# Patient Record
Sex: Male | Born: 2000 | Race: White | Hispanic: No | Marital: Single | Smoking: Former smoker
Health system: Southern US, Community
[De-identification: ages and names within clinical notes are randomized; demographics above are authoritative.]

## PROBLEM LIST (undated history)

## (undated) DIAGNOSIS — F32A Depression, unspecified: Secondary | ICD-10-CM

## (undated) DIAGNOSIS — F419 Anxiety disorder, unspecified: Secondary | ICD-10-CM

## (undated) DIAGNOSIS — J45909 Unspecified asthma, uncomplicated: Secondary | ICD-10-CM

## (undated) DIAGNOSIS — I10 Essential (primary) hypertension: Secondary | ICD-10-CM

## (undated) DIAGNOSIS — F329 Major depressive disorder, single episode, unspecified: Secondary | ICD-10-CM

## (undated) DIAGNOSIS — F840 Autistic disorder: Secondary | ICD-10-CM

## (undated) HISTORY — PX: PYLOROPLASTY: SHX418

---
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
## 2019-04-21 ENCOUNTER — Emergency Department (HOSPITAL_COMMUNITY)
Admission: EM | Admit: 2019-04-21 | Discharge: 2019-04-21 | Disposition: A | Discharge status: Home / Self Care | Payer: Medicaid Other | Attending: Emergency Medicine | Admitting: Emergency Medicine

## 2019-04-21 ENCOUNTER — Other Ambulatory Visit: Payer: Self-pay

## 2019-04-21 ENCOUNTER — Encounter (HOSPITAL_COMMUNITY): Payer: Self-pay | Admitting: Emergency Medicine

## 2019-04-21 DIAGNOSIS — Z5321 Procedure and treatment not carried out due to patient leaving prior to being seen by health care provider: Secondary | ICD-10-CM | POA: Insufficient documentation

## 2019-04-21 DIAGNOSIS — T63441A Toxic effect of venom of bees, accidental (unintentional), initial encounter: Secondary | ICD-10-CM | POA: Diagnosis present

## 2019-04-21 NOTE — ED Triage Notes (Signed)
Pt was stung by wasp on right finger earlier and since mother is allergic to bees thought best if patient came to ED for evaluation. Pt denies any symptoms besides pain on finger that was stung on.

## 2019-10-29 ENCOUNTER — Ambulatory Visit: Payer: Self-pay | Admitting: Surgery

## 2019-10-29 NOTE — H&P (Signed)
History of Present Illness Tyler Cunningham. Tyler Hanken MD; 10/29/2019 3:46 PM) The patient is a 19 year old male who presents with a pilonidal cyst. Referred by Dr. Baltazar Cunningham for pilonidal abscess  This is a 19 year old male with autism who presents with several months of a tender swollen area near his tailbone. This area has opened up and has been having some yellowish drainage. This area is fairly tender with ambulation in one sitting. The patient has not had any previous episodes of pilonidal abscess. He was treated with antibiotics but is currently not on antibiotics. He is referred to [place] for surgical evaluation. He is accompanied by his mother. She is a former patient of mine.   Diagnostic Studies History [facility] [name], [name]; 10/29/2019 2:34 PM) Colonoscopy never  Allergies [facility] [name], CMA; 10/29/2019 2:34 PM) Rondec *COUGH/COLD/ALLERGY* Allergies Reconciled  Medication History ([name], CMA; 10/29/2019 2:35 PM) Effexor (Oral) Specific strength unknown - Active. Catapres (Oral) Specific strength unknown - Active. Tylenol (Oral) Specific strength unknown - Active. Medications Reconciled  Social History [name], CMA; 10/29/2019 2:34 PM) Alcohol use Occasional alcohol use. No drug use Tobacco use Current every day smoker.  Family History [name], [place]; 10/29/2019 2:34 PM) Arthritis Father, Mother. Breast Cancer Mother. Cancer Family Members In General. Depression Father. Heart disease in male family member before age 23 Hypertension Mother. Migraine Headache Brother, Father, Mother, Sister. Seizure disorder Sister.  Other Problems [name], CMA; 10/29/2019 2:34 PM) Anxiety Disorder Asthma Back Pain Depression High blood pressure Migraine Headache     Review of Systems ([name]; 10/29/2019 2:34 PM) General Present- Fatigue. Not Present- Appetite Loss, Chills, Fever, Night Sweats, Weight Gain and Weight Loss. HEENT Present-  Seasonal Allergies and Wears glasses/contact lenses. Not Present- Earache, Hearing Loss, Hoarseness, Nose Bleed, Oral Ulcers, Ringing in the Ears, Sinus Pain, Sore Throat, Visual Disturbances and Yellow Eyes. Breast Not Present- Breast Mass, Breast Pain, Nipple Discharge and Skin Changes. Cardiovascular Not Present- Chest Pain, Difficulty Breathing Lying Down, Leg Cramps, Palpitations, Rapid Heart Rate, Shortness of Breath and Swelling of Extremities. Gastrointestinal Not Present- Abdominal Pain, Bloating, Bloody Stool, Change in Bowel Habits, Chronic diarrhea, Constipation, Difficulty Swallowing, Excessive gas, Gets full quickly at meals, Hemorrhoids, Indigestion, Nausea, Rectal Pain and Vomiting. Musculoskeletal Present- Back Pain. Not Present- Joint Pain, Joint Stiffness, Muscle Pain, Muscle Weakness and Swelling of Extremities. Endocrine Not Present- Cold Intolerance, Excessive Hunger, Hair Changes, Heat Intolerance, Hot flashes and New Diabetes. Hematology Not Present- Blood Thinners, Easy Bruising, Excessive bleeding, Gland problems, HIV and Persistent Infections.  Vitals ([name] CMA; 10/29/2019 2:35 PM) 10/29/2019 2:35 PM Weight: 336.5 lb Height: 71in Body Surface Area: 2.63 m Body Mass Index: 46.93 kg/m  Temp.: 98.62F  Pulse: 115 (Regular)         Physical Exam [name] MD; 10/29/2019 3:47 PM)  The physical exam findings are as follows: Note:Constitutional: WDWN in NAD, conversant, no obvious deformities; resting comfortably Eyes: Pupils equal, round; sclera anicteric; moist conjunctiva; no lid lag HENT: Oral mucosa moist; good dentition Neck: No masses palpated, trachea midline; no thyromegaly Lungs: CTA bilaterally; normal respiratory effort CV: Regular rate and rhythm; no murmurs; extremities well-perfused with no edema Abd: +bowel sounds, soft, non-tender, no palpable organomegaly; no palpable hernias Coccygeal area - the patient has a 1.5 cm  protruding mass with a small opening with minimal drainage. This area is slightly tender to palpation. This is located just over the cuff 6. Inferiorly the patient has several open skin pits  with some crusting. No erythema or purulent drainage coming from these areas. The entire area covers a distance of about 2.5-3 cm. Musc: Normal gait; no apparent clubbing or cyanosis in extremities Lymphatic: No palpable cervical or axillary lymphadenopathy Skin: Warm, dry; no sign of jaundice Psychiatric - alert and oriented x 4; calm mood and affect    Assessment & Plan [name] K. Ethelyne Erich MD; 10/29/2019 2:43 PM)  PILONIDAL SINUS WITH ABSCESS (L05.02)  Current Plans Schedule for Surgery - Pilonidal cystectomy. The surgical procedure has been discussed with the patient. Potential risks, benefits, alternative treatments, and expected outcomes have been explained. All of the patient's questions at this time have been answered. The likelihood of reaching the patient's treatment goal is good. The patient understand the proposed surgical procedure and wishes to proceed.  [name]. [name], MD, [facility] Surgery  General/ Trauma Surgery   10/29/2019 3:48 PM

## 2019-12-24 ENCOUNTER — Other Ambulatory Visit: Payer: Self-pay

## 2019-12-24 ENCOUNTER — Encounter (HOSPITAL_BASED_OUTPATIENT_CLINIC_OR_DEPARTMENT_OTHER): Payer: Self-pay | Admitting: Surgery

## 2019-12-25 ENCOUNTER — Other Ambulatory Visit (HOSPITAL_COMMUNITY)
Admission: RE | Admit: 2019-12-25 | Discharge: 2019-12-25 | Disposition: A | Discharge status: Home / Self Care | Payer: Medicare Other | Source: Ambulatory Visit | Attending: Surgery | Admitting: Surgery

## 2019-12-25 DIAGNOSIS — Z01812 Encounter for preprocedural laboratory examination: Secondary | ICD-10-CM | POA: Insufficient documentation

## 2019-12-25 DIAGNOSIS — Z20822 Contact with and (suspected) exposure to covid-19: Secondary | ICD-10-CM | POA: Insufficient documentation

## 2019-12-25 LAB — SARS CORONAVIRUS 2 (TAT 6-24 HRS): SARS Coronavirus 2: NEGATIVE

## 2019-12-28 NOTE — Progress Notes (Signed)
Left message with patient to remind him of coming to [facility] for anesthesia consult.

## 2019-12-29 ENCOUNTER — Encounter (HOSPITAL_BASED_OUTPATIENT_CLINIC_OR_DEPARTMENT_OTHER): Payer: Self-pay | Admitting: Surgery

## 2019-12-29 ENCOUNTER — Other Ambulatory Visit: Payer: Self-pay

## 2019-12-29 ENCOUNTER — Ambulatory Visit (HOSPITAL_BASED_OUTPATIENT_CLINIC_OR_DEPARTMENT_OTHER): Payer: Medicare Other | Admitting: Anesthesiology

## 2019-12-29 ENCOUNTER — Ambulatory Visit (HOSPITAL_BASED_OUTPATIENT_CLINIC_OR_DEPARTMENT_OTHER)
Admission: RE | Admit: 2019-12-29 | Discharge: 2019-12-29 | Disposition: A | Discharge status: Home / Self Care | Payer: Medicare Other | Attending: Surgery | Admitting: Surgery

## 2019-12-29 ENCOUNTER — Encounter (HOSPITAL_BASED_OUTPATIENT_CLINIC_OR_DEPARTMENT_OTHER)
Admission: RE | Disposition: A | Discharge status: Home / Self Care | Payer: Self-pay | Source: Home / Self Care | Attending: Surgery

## 2019-12-29 DIAGNOSIS — F329 Major depressive disorder, single episode, unspecified: Secondary | ICD-10-CM | POA: Diagnosis not present

## 2019-12-29 DIAGNOSIS — L0502 Pilonidal sinus with abscess: Secondary | ICD-10-CM | POA: Diagnosis not present

## 2019-12-29 DIAGNOSIS — F17200 Nicotine dependence, unspecified, uncomplicated: Secondary | ICD-10-CM | POA: Insufficient documentation

## 2019-12-29 DIAGNOSIS — J45909 Unspecified asthma, uncomplicated: Secondary | ICD-10-CM | POA: Diagnosis not present

## 2019-12-29 DIAGNOSIS — Z79899 Other long term (current) drug therapy: Secondary | ICD-10-CM | POA: Insufficient documentation

## 2019-12-29 DIAGNOSIS — Z6841 Body Mass Index (BMI) 40.0 and over, adult: Secondary | ICD-10-CM | POA: Diagnosis not present

## 2019-12-29 DIAGNOSIS — F419 Anxiety disorder, unspecified: Secondary | ICD-10-CM | POA: Insufficient documentation

## 2019-12-29 DIAGNOSIS — F840 Autistic disorder: Secondary | ICD-10-CM | POA: Insufficient documentation

## 2019-12-29 DIAGNOSIS — L0501 Pilonidal cyst with abscess: Secondary | ICD-10-CM | POA: Diagnosis present

## 2019-12-29 DIAGNOSIS — I10 Essential (primary) hypertension: Secondary | ICD-10-CM | POA: Insufficient documentation

## 2019-12-29 HISTORY — PX: PILONIDAL CYST EXCISION: SHX744

## 2019-12-29 SURGERY — EXCISION, SIMPLE PILONIDAL CYST
Anesthesia: General | Site: Coccyx

## 2019-12-29 MED ORDER — PROPOFOL 10 MG/ML IV BOLUS
INTRAVENOUS | Status: AC
  Filled 2019-12-29: qty 40

## 2019-12-29 MED ORDER — ACETAMINOPHEN 500 MG PO TABS
1000.0000 mg | ORAL_TABLET | Freq: Once | ORAL | Status: AC
  Administered 2019-12-29: 1000 mg via ORAL

## 2019-12-29 MED ORDER — PROPOFOL 500 MG/50ML IV EMUL
INTRAVENOUS | Status: AC
  Filled 2019-12-29: qty 50

## 2019-12-29 MED ORDER — LACTATED RINGERS IV SOLN: INTRAVENOUS | Status: DC

## 2019-12-29 MED ORDER — DEXAMETHASONE SODIUM PHOSPHATE 10 MG/ML IJ SOLN
INTRAMUSCULAR | Status: AC
  Filled 2019-12-29: qty 2

## 2019-12-29 MED ORDER — CHLORHEXIDINE GLUCONATE CLOTH 2 % EX PADS: 6.0000 | MEDICATED_PAD | Freq: Once | CUTANEOUS | Status: DC

## 2019-12-29 MED ORDER — FENTANYL CITRATE (PF) 100 MCG/2ML IJ SOLN
INTRAMUSCULAR | Status: AC
  Filled 2019-12-29: qty 2

## 2019-12-29 MED ORDER — LIDOCAINE 2% (20 MG/ML) 5 ML SYRINGE
INTRAMUSCULAR | Status: AC
  Filled 2019-12-29: qty 15

## 2019-12-29 MED ORDER — OXYCODONE HCL 5 MG PO TABS: 5.0000 mg | ORAL_TABLET | Freq: Four times a day (QID) | ORAL | 0 refills | Status: DC | PRN

## 2019-12-29 MED ORDER — CEFAZOLIN SODIUM-DEXTROSE 2-4 GM/100ML-% IV SOLN
INTRAVENOUS | Status: AC
  Filled 2019-12-29: qty 100

## 2019-12-29 MED ORDER — ONDANSETRON HCL 4 MG/2ML IJ SOLN
INTRAMUSCULAR | Status: DC | PRN
  Administered 2019-12-29: 4 mg via INTRAVENOUS

## 2019-12-29 MED ORDER — FENTANYL CITRATE (PF) 100 MCG/2ML IJ SOLN
INTRAMUSCULAR | Status: DC | PRN
  Administered 2019-12-29: 100 ug via INTRAVENOUS
  Administered 2019-12-29 (×2): 50 ug via INTRAVENOUS

## 2019-12-29 MED ORDER — SUGAMMADEX SODIUM 200 MG/2ML IV SOLN
INTRAVENOUS | Status: DC | PRN
  Administered 2019-12-29: 300 mg via INTRAVENOUS

## 2019-12-29 MED ORDER — PROPOFOL 10 MG/ML IV BOLUS
INTRAVENOUS | Status: DC | PRN
  Administered 2019-12-29: 200 mg via INTRAVENOUS

## 2019-12-29 MED ORDER — ACETAMINOPHEN 500 MG PO TABS
ORAL_TABLET | ORAL | Status: AC
  Filled 2019-12-29: qty 2

## 2019-12-29 MED ORDER — SUGAMMADEX SODIUM 500 MG/5ML IV SOLN
INTRAVENOUS | Status: AC
  Filled 2019-12-29: qty 5

## 2019-12-29 MED ORDER — OXYCODONE HCL 5 MG/5ML PO SOLN: 5.0000 mg | Freq: Once | ORAL | Status: DC | PRN

## 2019-12-29 MED ORDER — MIDAZOLAM HCL 2 MG/2ML IJ SOLN
INTRAMUSCULAR | Status: AC
  Filled 2019-12-29: qty 2

## 2019-12-29 MED ORDER — DEXTROSE 5 % IV SOLN
3.0000 g | INTRAVENOUS | Status: AC
  Administered 2019-12-29: 10:00:00 3 g via INTRAVENOUS

## 2019-12-29 MED ORDER — DEXAMETHASONE SODIUM PHOSPHATE 10 MG/ML IJ SOLN
INTRAMUSCULAR | Status: DC | PRN
  Administered 2019-12-29: 5 mg via INTRAVENOUS

## 2019-12-29 MED ORDER — PROPOFOL 10 MG/ML IV BOLUS
INTRAVENOUS | Status: AC
  Filled 2019-12-29: qty 20

## 2019-12-29 MED ORDER — PROMETHAZINE HCL 25 MG/ML IJ SOLN: 6.2500 mg | INTRAMUSCULAR | Status: DC | PRN

## 2019-12-29 MED ORDER — FENTANYL CITRATE (PF) 100 MCG/2ML IJ SOLN: 25.0000 ug | INTRAMUSCULAR | Status: DC | PRN

## 2019-12-29 MED ORDER — BUPIVACAINE HCL (PF) 0.25 % IJ SOLN
INTRAMUSCULAR | Status: DC | PRN
Start: 2019-12-29 — End: 2019-12-29
  Administered 2019-12-29: 30 mL

## 2019-12-29 MED ORDER — LIDOCAINE 2% (20 MG/ML) 5 ML SYRINGE
INTRAMUSCULAR | Status: DC | PRN
  Administered 2019-12-29: 100 mg via INTRAVENOUS

## 2019-12-29 MED ORDER — MIDAZOLAM HCL 5 MG/5ML IJ SOLN
INTRAMUSCULAR | Status: DC | PRN
  Administered 2019-12-29: 2 mg via INTRAVENOUS

## 2019-12-29 MED ORDER — CEFAZOLIN SODIUM-DEXTROSE 1-4 GM/50ML-% IV SOLN
INTRAVENOUS | Status: AC
  Filled 2019-12-29: qty 50

## 2019-12-29 MED ORDER — ONDANSETRON HCL 4 MG/2ML IJ SOLN
INTRAMUSCULAR | Status: AC
  Filled 2019-12-29: qty 4

## 2019-12-29 MED ORDER — ROCURONIUM BROMIDE 10 MG/ML (PF) SYRINGE
PREFILLED_SYRINGE | INTRAVENOUS | Status: DC | PRN
  Administered 2019-12-29: 60 mg via INTRAVENOUS

## 2019-12-29 MED ORDER — OXYCODONE HCL 5 MG PO TABS: 5.0000 mg | ORAL_TABLET | Freq: Once | ORAL | Status: DC | PRN

## 2019-12-29 SURGICAL SUPPLY — 41 items
APL SKNCLS STERI-STRIP NONHPOA (GAUZE/BANDAGES/DRESSINGS) ×1
BENZOIN TINCTURE PRP APPL 2/3 (GAUZE/BANDAGES/DRESSINGS) ×3 IMPLANT
BLADE CLIPPER SURG (BLADE) ×3 IMPLANT
BLADE SURG 10 STRL SS (BLADE) ×3 IMPLANT
BRIEF STRETCH FOR OB PAD XXL (UNDERPADS AND DIAPERS) ×2 IMPLANT
CANISTER SUCT 1200ML W/VALVE (MISCELLANEOUS) ×3 IMPLANT
CLEANER CAUTERY TIP 5X5 PAD (MISCELLANEOUS) ×1 IMPLANT
COVER BACK TABLE 60X90IN (DRAPES) ×3 IMPLANT
COVER MAYO STAND STRL (DRAPES) ×3 IMPLANT
DRAPE LAPAROTOMY T 102X78X121 (DRAPES) ×3 IMPLANT
DRSG PAD ABDOMINAL 8X10 ST (GAUZE/BANDAGES/DRESSINGS) ×2 IMPLANT
ELECT REM PT RETURN 9FT ADLT (ELECTROSURGICAL) ×3
ELECTRODE REM PT RTRN 9FT ADLT (ELECTROSURGICAL) ×1 IMPLANT
GAUZE SPONGE 4X4 12PLY STRL (GAUZE/BANDAGES/DRESSINGS) ×2 IMPLANT
GAUZE SPONGE 4X4 12PLY STRL LF (GAUZE/BANDAGES/DRESSINGS) ×6 IMPLANT
GLOVE BIO SURGEON STRL SZ 6.5 (GLOVE) ×1 IMPLANT
GLOVE BIO SURGEON STRL SZ7 (GLOVE) ×3 IMPLANT
GLOVE BIO SURGEONS STRL SZ 6.5 (GLOVE) ×1
GLOVE BIOGEL PI IND STRL 6.5 (GLOVE) IMPLANT
GLOVE BIOGEL PI IND STRL 7.5 (GLOVE) ×1 IMPLANT
GLOVE BIOGEL PI INDICATOR 6.5 (GLOVE) ×2
GLOVE BIOGEL PI INDICATOR 7.5 (GLOVE) ×2
GOWN STRL REUS W/ TWL LRG LVL3 (GOWN DISPOSABLE) ×1 IMPLANT
GOWN STRL REUS W/TWL LRG LVL3 (GOWN DISPOSABLE) ×6
NEEDLE HYPO 22GX1.5 SAFETY (NEEDLE) ×3 IMPLANT
PACK BASIN DAY SURGERY FS (CUSTOM PROCEDURE TRAY) ×3 IMPLANT
PAD CLEANER CAUTERY TIP 5X5 (MISCELLANEOUS) ×2
PENCIL SMOKE EVACUATOR (MISCELLANEOUS) ×3 IMPLANT
SLEEVE SCD COMPRESS KNEE MED (MISCELLANEOUS) ×3 IMPLANT
SPONGE LAP 18X18 RF (DISPOSABLE) ×2 IMPLANT
SPONGE LAP 4X18 RFD (DISPOSABLE) ×3 IMPLANT
STAPLER VISISTAT 35W (STAPLE) ×3 IMPLANT
SUT VIC AB 2-0 CT1 27 (SUTURE) ×3
SUT VIC AB 2-0 CT1 TAPERPNT 27 (SUTURE) ×1 IMPLANT
SUT VICRYL 3-0 CR8 SH (SUTURE) ×2 IMPLANT
SYR CONTROL 10ML LL (SYRINGE) ×3 IMPLANT
TAPE CLOTH 3X10 TAN LF (GAUZE/BANDAGES/DRESSINGS) ×3 IMPLANT
TRAY DSU PREP LF (CUSTOM PROCEDURE TRAY) ×3 IMPLANT
TUBE CONNECTING 20'X1/4 (TUBING) ×1
TUBE CONNECTING 20X1/4 (TUBING) ×2 IMPLANT
YANKAUER SUCT BULB TIP NO VENT (SUCTIONS) ×3 IMPLANT

## 2019-12-29 NOTE — Anesthesia Postprocedure Evaluation (Signed)
Anesthesia Post Note  Patient: Tyler Cunningham  Procedure(s) Performed: PILONIDAL CYSTECTOMY (N/A Coccyx)     Patient location during evaluation: PACU Anesthesia Type: General Level of consciousness: awake and alert and oriented Pain management: pain level controlled Vital Signs Assessment: post-procedure vital signs reviewed and stable Respiratory status: spontaneous breathing, nonlabored ventilation and respiratory function stable Cardiovascular status: blood pressure returned to baseline Postop Assessment: no apparent nausea or vomiting Anesthetic complications: no   No complications documented.  Last Vitals:  Vitals:   12/29/19 1130 12/29/19 1144  BP: 118/72   Pulse: 95 91  Resp: 16   Temp:    SpO2: 96% 96%    Last Pain:  Vitals:   12/29/19 1144  TempSrc:   PainSc: 0-No pain                 Kaylyn Layer

## 2019-12-29 NOTE — Op Note (Signed)
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 This area has opened up and has been having some yellowish drainage. This area is fairly tender with ambulation in one sitting. The patient has not had any previous episodes of pilonidal abscess. He was treated with antibiotics but is currently not on antibiotics.   Upon examination in the office, he had a palpable 1.5 cm chronic draining abscess.  He also has multiple open skin pits in the midline.  We recommended complete pilonidal cystectomy to prevent chronic drainage from this area.  Description of procedure: The patient is brought to the operating room and placed in the supine position on a stretcher.  After an adequate level of general anesthesia was obtained, he was moved to a prone position on the operating table.  His natal cleft was shaved, prepped with Betadine, and draped in sterile fashion.  A timeout was taken to ensure the proper patient and proper procedure.  The patient has a 1.5 cm area that is protruding slightly with a chronic open draining sinus.  More inferiorly, he has multiple open skin pits in the midline.  There are long protruding hairs coming from these skin pits.  I made an elliptical incision to include all of the skin pits as well as the chronic abscess posteriorly.  We dissected down into the subcutaneous tissue to normal-appearing adipose tissue.  We excised the entire abscess and all of the midline skin pits down to healthy tissue.  Hemostasis was obtained with cautery.  We irrigated the wound thoroughly.  We inspected for hemostasis.  I closed the deep part of the wound with multiple interrupted 3-0 Vicryl sutures.  4 x 4 gauze was moistened and a thin strip of gauze was packed  into the wound.  An ABD dressing was applied.  The patient was then moved back to supine position.  He was extubated and brought to the recovery room in stable condition.  All sponge, instrument, and needle counts are correct.  [name]. [name], MD, [facility]  General/ Trauma Surgery   12/29/2019 10:59 AM

## 2019-12-29 NOTE — Transfer of Care (Signed)
Immediate Anesthesia Transfer of Care Note  Patient: Tyler Cunningham  Procedure(s) Performed: PILONIDAL CYSTECTOMY (N/A Coccyx)  Patient Location: PACU  Anesthesia Type:General  Level of Consciousness: awake and alert   Airway & Oxygen Therapy: Patient Spontanous Breathing  Post-op Assessment: Report given to RN and Post -op Vital signs reviewed and stable  Post vital signs: Reviewed and stable  Last Vitals:  Vitals Value Taken Time  BP    Temp    Pulse 105 12/29/19 1111  Resp 18 12/29/19 1111  SpO2 97 % 12/29/19 1111  Vitals shown include unvalidated device data.  Last Pain:  Vitals:   12/29/19 0900  TempSrc: Oral  PainSc: 0-No pain         Complications: No complications documented.

## 2019-12-29 NOTE — Anesthesia Preprocedure Evaluation (Addendum)
Anesthesia Evaluation  Patient identified by MRN, date of birth, ID band Patient awake    Reviewed: Allergy & Precautions, NPO status , Patient's Chart, lab work & pertinent test results  History of Anesthesia Complications Negative for: history of anesthetic complications  Airway Mallampati: II  TM Distance: >3 FB Neck ROM: Full    Dental  (+) Chipped,    Pulmonary asthma , Current Smoker,    Pulmonary exam normal        Cardiovascular negative cardio ROS Normal cardiovascular exam     Neuro/Psych Anxiety Depression ASD    GI/Hepatic negative GI ROS, Neg liver ROS,   Endo/Other  Morbid obesity  Renal/GU negative Renal ROS  negative genitourinary   Musculoskeletal negative musculoskeletal ROS (+)   Abdominal   Peds  Hematology negative hematology ROS (+)   Anesthesia Other Findings Day of surgery medications reviewed with patient.  Reproductive/Obstetrics negative OB ROS                            Anesthesia Physical Anesthesia Plan  ASA: III  Anesthesia Plan: General   Post-op Pain Management:    Induction: Intravenous  PONV Risk Score and Plan: 3 and Treatment may vary due to age or medical condition, Ondansetron, Dexamethasone, Midazolam and Scopolamine patch - Pre-op  Airway Management Planned: Oral ETT  Additional Equipment: None  Intra-op Plan:   Post-operative Plan: Extubation in OR  Informed Consent: I have reviewed the patients History and Physical, chart, labs and discussed the procedure including the risks, benefits and alternatives for the proposed anesthesia with the patient or authorized representative who has indicated his/her understanding and acceptance.     Dental advisory given  Plan Discussed with: CRNA  Anesthesia Plan Comments:        Anesthesia Quick Evaluation

## 2019-12-29 NOTE — H&P (Signed)
History of Present Illness The patient is a 19 year old male who presents with a pilonidal cyst. Referred by Dr. Cyril Mourning for pilonidal abscess  This is a 19 year old male with autism who presents with several months of a tender swollen area near his tailbone. This area has opened up and has been having some yellowish drainage. This area is fairly tender with ambulation in one sitting. The patient has not had any previous episodes of pilonidal abscess. He was treated with antibiotics but is currently not on antibiotics. He is referred to [place] for surgical evaluation. He is accompanied by his mother. She is a former patient of mine.   Diagnostic Studies History  Colonoscopy never  Allergies  Rondec *COUGH/COLD/ALLERGY* Allergies Reconciled  Medication History  Effexor (Oral) Specific strength unknown - Active. Catapres (Oral) Specific strength unknown - Active. Tylenol (Oral) Specific strength unknown - Active. Medications Reconciled  Social History  Alcohol use Occasional alcohol use. No drug use Tobacco use Current every day smoker.  Family History  Arthritis Father, Mother. Breast Cancer Mother. Cancer Family Members In General. Depression Father. Heart disease in male family member before age 92 Hypertension Mother. Migraine Headache Brother, Father, Mother, Sister. Seizure disorder Sister.  Other Problems  Anxiety Disorder Asthma Back Pain Depression High blood pressure Migraine Headache     Review of Systems  General Present- Fatigue. Not Present- Appetite Loss, Chills, Fever, Night Sweats, Weight Gain and Weight Loss. HEENT Present- Seasonal Allergies and Wears glasses/contact lenses. Not Present- Earache, Hearing Loss, Hoarseness, Nose Bleed, Oral Ulcers, Ringing in the Ears, Sinus Pain, Sore Throat, Visual Disturbances and Yellow Eyes. Breast Not Present- Breast Mass, Breast Pain, Nipple Discharge and Skin  Changes. Cardiovascular Not Present- Chest Pain, Difficulty Breathing Lying Down, Leg Cramps, Palpitations, Rapid Heart Rate, Shortness of Breath and Swelling of Extremities. Gastrointestinal Not Present- Abdominal Pain, Bloating, Bloody Stool, Change in Bowel Habits, Chronic diarrhea, Constipation, Difficulty Swallowing, Excessive gas, Gets full quickly at meals, Hemorrhoids, Indigestion, Nausea, Rectal Pain and Vomiting. Musculoskeletal Present- Back Pain. Not Present- Joint Pain, Joint Stiffness, Muscle Pain, Muscle Weakness and Swelling of Extremities. Endocrine Not Present- Cold Intolerance, Excessive Hunger, Hair Changes, Heat Intolerance, Hot flashes and New Diabetes. Hematology Not Present- Blood Thinners, Easy Bruising, Excessive bleeding, Gland problems, HIV and Persistent Infections.  Vitals  Weight: 336.5 lb Height: 71in Body Surface Area: 2.63 m Body Mass Index: 46.93 kg/m  Temp.: 98.77F  Pulse: 115 (Regular)         Physical Exam   The physical exam findings are as follows: Note:Constitutional: WDWN in NAD, conversant, no obvious deformities; resting comfortably Eyes: Pupils equal, round; sclera anicteric; moist conjunctiva; no lid lag HENT: Oral mucosa moist; good dentition Neck: No masses palpated, trachea midline; no thyromegaly Lungs: CTA bilaterally; normal respiratory effort CV: Regular rate and rhythm; no murmurs; extremities well-perfused with no edema Abd: +bowel sounds, soft, non-tender, no palpable organomegaly; no palpable hernias Coccygeal area - the patient has a 1.5 cm protruding mass with a small opening with minimal drainage. This area is slightly tender to palpation. This is located just over the coccyx. Inferiorly the patient has several open skin pits with some crusting. No erythema or purulent drainage coming from these areas. The entire area covers a distance of about 2.5-3 cm. Musc: Normal gait; no apparent clubbing or cyanosis  in extremities Lymphatic: No palpable cervical or axillary lymphadenopathy Skin: Warm, dry; no sign of jaundice Psychiatric - alert and oriented x 4; calm mood and affect  Assessment & Plan  PILONIDAL SINUS WITH ABSCESS (L05.02)  Current Plans Schedule for Surgery - Pilonidal cystectomy. The surgical procedure has been discussed with the patient. Potential risks, benefits, alternative treatments, and expected outcomes have been explained. All of the patient's questions at this time have been answered. The likelihood of reaching the patient's treatment goal is good. The patient understand the proposed surgical procedure and wishes to proceed.   [name]. [name], MD, [facility]  General/ Trauma Surgery   12/29/2019 9:37 AM

## 2019-12-29 NOTE — Discharge Instructions (Signed)
[facility] Office Phone Number [phone]  Pilonidal Excision: POST OP INSTRUCTIONS  Always review your discharge instruction sheet given to you by the facility where your surgery was performed.  IF YOU HAVE DISABILITY OR FAMILY LEAVE FORMS, YOU MUST BRING THEM TO THE OFFICE FOR PROCESSING.  DO NOT GIVE THEM TO YOUR DOCTOR.  1. A prescription for pain medication has been sent to your pharmacy.  Take your pain medication as prescribed, if needed.  If narcotic pain medicine is not needed, then you may take acetaminophen (Tylenol) or ibuprofen (Advil) as needed. 2. Take your usually prescribed medications unless otherwise directed 3. If you need a refill on your pain medication, please contact your pharmacy.  They will contact our office to request authorization.  Prescriptions will not be filled after 5pm or on week-ends. 4. You should eat very light the first 24 hours after surgery, such as soup, crackers, pudding, etc.  Resume your normal diet the day after surgery. 5. Most patients will experience some swelling and bruising around the surgical site.  Ice packs will help.  Swelling and bruising can take several days to resolve.   6. It is common to experience some constipation if taking pain medication after surgery.  Increasing fluid intake and taking a stool softener will usually help or prevent this problem from occurring.  A mild laxative (Milk of Magnesia or Miralax) should be taken according to package directions if there are no bowel movements after 48 hours. 7. Wound care - daily wet to dry dressings to wound (4x4 gauze moistened with saline and gently packed into the wound.  Cover with dry gauze and tape. 8. ACTIVITIES:  You may resume regular daily activities (gradually increasing) beginning the next day.   You may have sexual intercourse when it is comfortable. a. You may drive when you no longer are taking prescription pain medication, you can comfortably wear a  seatbelt, and you can safely maneuver your car and apply brakes. b. RETURN TO WORK:  1-2 weeks 9. You should see your doctor in the office for a follow-up appointment approximately two to three weeks after your surgery.    WHEN TO CALL YOUR DOCTOR: 1. Fever over 101.0 2. Nausea and/or vomiting. 3. Extreme swelling or bruising. 4. Continued bleeding from incision. 5. Increased pain, redness, or drainage from the incision.  The clinic staff is available to answer your questions during regular business hours.  Please don't hesitate to call and ask to speak to one of the nurses for clinical concerns.  If you have a medical emergency, go to the nearest emergency room or call 911.  A surgeon from [facility] is always on call at the hospital.  For further questions, please visit [email or web address]     NO TYLENOL PRODUCTS UNTIL 3:00 PM     Post Anesthesia Home Care Instructions  Activity: Get plenty of rest for the remainder of the day. A responsible individual must stay with you for 24 hours following the procedure.  For the next 24 hours, DO NOT: -Drive a car -Advertising copywriter -Drink alcoholic beverages -Take any medication unless instructed by your physician -Make any legal decisions or sign important papers.  Meals: Start with liquid foods such as gelatin or soup. Progress to regular foods as tolerated. Avoid greasy, spicy, heavy foods. If nausea and/or vomiting occur, drink only clear liquids until the nausea and/or vomiting subsides. Call your physician if vomiting continues.  Special Instructions/Symptoms: Your throat may feel dry or  sore from the anesthesia or the breathing tube placed in your throat during surgery. If this causes discomfort, gargle with warm salt water. The discomfort should disappear within 24 hours.  If you had a scopolamine patch placed behind your ear for the management of post- operative nausea and/or vomiting:  1. The medication  in the patch is effective for 72 hours, after which it should be removed.  Wrap patch in a tissue and discard in the trash. Wash hands thoroughly with soap and water. 2. You may remove the patch earlier than 72 hours if you experience unpleasant side effects which may include dry mouth, dizziness or visual disturbances. 3. Avoid touching the patch. Wash your hands with soap and water after contact with the patch.

## 2019-12-29 NOTE — Anesthesia Procedure Notes (Signed)
Procedure Name: Intubation Date/Time: 12/29/2019 10:10 AM Performed by: Elliot Dally, CRNA Pre-anesthesia Checklist: Patient identified, Emergency Drugs available, Suction available and Patient being monitored Patient Re-evaluated:Patient Re-evaluated prior to induction Oxygen Delivery Method: Circle System Utilized Preoxygenation: Pre-oxygenation with 100% oxygen Induction Type: IV induction Ventilation: Mask ventilation without difficulty and Oral airway inserted - appropriate to patient size Laryngoscope Size: Miller and 3 Grade View: Grade I Tube type: Oral Tube size: 7.5 mm Number of attempts: 1 Airway Equipment and Method: Stylet and Oral airway Placement Confirmation: ETT inserted through vocal cords under direct vision,  positive ETCO2 and breath sounds checked- equal and bilateral Secured at: 24 cm Tube secured with: Tape Dental Injury: Teeth and Oropharynx as per pre-operative assessment

## 2019-12-30 ENCOUNTER — Encounter (HOSPITAL_BASED_OUTPATIENT_CLINIC_OR_DEPARTMENT_OTHER): Payer: Self-pay | Admitting: Surgery

## 2019-12-30 LAB — SURGICAL PATHOLOGY

## 2020-04-24 ENCOUNTER — Encounter (HOSPITAL_COMMUNITY): Payer: Self-pay | Admitting: Emergency Medicine

## 2020-04-24 ENCOUNTER — Other Ambulatory Visit: Payer: Self-pay

## 2020-04-24 ENCOUNTER — Emergency Department (HOSPITAL_COMMUNITY)
Admission: EM | Admit: 2020-04-24 | Discharge: 2020-04-24 | Disposition: A | Discharge status: Home / Self Care | Payer: Medicare Other | Attending: Emergency Medicine | Admitting: Emergency Medicine

## 2020-04-24 DIAGNOSIS — F17210 Nicotine dependence, cigarettes, uncomplicated: Secondary | ICD-10-CM | POA: Diagnosis not present

## 2020-04-24 DIAGNOSIS — F840 Autistic disorder: Secondary | ICD-10-CM | POA: Diagnosis not present

## 2020-04-24 DIAGNOSIS — J45909 Unspecified asthma, uncomplicated: Secondary | ICD-10-CM | POA: Diagnosis not present

## 2020-04-24 DIAGNOSIS — L0501 Pilonidal cyst with abscess: Secondary | ICD-10-CM | POA: Diagnosis present

## 2020-04-24 LAB — COMPREHENSIVE METABOLIC PANEL
ALT: 51 U/L — ABNORMAL HIGH (ref 0–44)
AST: 14 U/L — ABNORMAL LOW (ref 15–41)
Albumin: 3.6 g/dL (ref 3.5–5.0)
Alkaline Phosphatase: 51 U/L (ref 38–126)
Anion gap: 11 (ref 5–15)
BUN: 12 mg/dL (ref 6–20)
CO2: 22 mmol/L (ref 22–32)
Calcium: 10.1 mg/dL (ref 8.9–10.3)
Chloride: 103 mmol/L (ref 98–111)
Creatinine, Ser: 0.87 mg/dL (ref 0.61–1.24)
GFR, Estimated: >60 mL/min (ref >60)
Glucose, Bld: 151 mg/dL — ABNORMAL HIGH (ref 70–99)
Potassium: 3.8 mmol/L (ref 3.5–5.1)
Sodium: 136 mmol/L (ref 135–145)
Total Bilirubin: 0.8 mg/dL (ref 0.3–1.2)
Total Protein: 7.6 g/dL (ref 6.5–8.1)

## 2020-04-24 LAB — URINALYSIS, ROUTINE W REFLEX MICROSCOPIC
Bacteria, UA: NONE SEEN
Bilirubin Urine: NEGATIVE
Glucose, UA: NEGATIVE mg/dL
Hgb urine dipstick: NEGATIVE
Ketones, ur: NEGATIVE mg/dL
Leukocytes,Ua: NEGATIVE
Nitrite: NEGATIVE
Protein, ur: 30 mg/dL — AB
Specific Gravity, Urine: 1.023 (ref 1.005–1.030)
pH: 6 (ref 5.0–8.0)

## 2020-04-24 LAB — CBC WITH DIFFERENTIAL/PLATELET
Abs Immature Granulocytes: 0.07 10*3/uL (ref 0.00–0.07)
Basophils Absolute: 0 10*3/uL (ref 0.0–0.1)
Basophils Relative: 0 %
Eosinophils Absolute: 0.2 10*3/uL (ref 0.0–0.5)
Eosinophils Relative: 1 %
HCT: 45.5 % (ref 39.0–52.0)
Hemoglobin: 15.6 g/dL (ref 13.0–17.0)
Immature Granulocytes: 1 %
Lymphocytes Relative: 21 %
Lymphs Abs: 3.1 10*3/uL (ref 0.7–4.0)
MCH: 29.9 pg (ref 26.0–34.0)
MCHC: 34.3 g/dL (ref 30.0–36.0)
MCV: 87.3 fL (ref 80.0–100.0)
Monocytes Absolute: 1 10*3/uL (ref 0.1–1.0)
Monocytes Relative: 6 %
Neutro Abs: 10.8 10*3/uL — ABNORMAL HIGH (ref 1.7–7.7)
Neutrophils Relative %: 71 %
Platelets: 455 10*3/uL — ABNORMAL HIGH (ref 150–400)
RBC: 5.21 MIL/uL (ref 4.22–5.81)
RDW: 12.3 % (ref 11.5–15.5)
WBC: 15.2 10*3/uL — ABNORMAL HIGH (ref 4.0–10.5)
nRBC: 0 % (ref 0.0–0.2)

## 2020-04-24 LAB — PROTIME-INR
INR: 1.1 (ref 0.8–1.2)
Prothrombin Time: 13.4 seconds (ref 11.4–15.2)

## 2020-04-24 LAB — LACTIC ACID, PLASMA: Lactic Acid, Venous: 1.4 mmol/L (ref 0.5–1.9)

## 2020-04-24 MED ORDER — HYDROCODONE-ACETAMINOPHEN 5-325 MG PO TABS
2.0000 | ORAL_TABLET | Freq: Once | ORAL | Status: AC
  Administered 2020-04-24: 2 via ORAL
  Filled 2020-04-24: qty 2

## 2020-04-24 MED ORDER — LIDOCAINE HCL (PF) 1 % IJ SOLN
5.0000 mL | Freq: Once | INTRAMUSCULAR | Status: AC
  Administered 2020-04-24: 5 mL
  Filled 2020-04-24: qty 5

## 2020-04-24 MED ORDER — DOXYCYCLINE HYCLATE 100 MG PO CAPS
100.0000 mg | ORAL_CAPSULE | Freq: Two times a day (BID) | ORAL | 0 refills | Status: DC
Start: 2020-04-24 — End: 2020-09-21

## 2020-04-24 MED ORDER — HYDROCODONE-ACETAMINOPHEN 5-325 MG PO TABS: 1.0000 | ORAL_TABLET | ORAL | 0 refills | Status: DC | PRN

## 2020-04-24 NOTE — ED Notes (Signed)
Pt is having abscess drained, currently unable to obtain vitals.

## 2020-04-24 NOTE — ED Triage Notes (Addendum)
  Patient comes in with abscess on buttock.  Patient had surgery for pilonidal cyst on 12/29/19.  Had no complications and it looked fine according to mom on Wednesday but patient went to turn over in bed around 0430 and patient felt a tear with a gush of fluid.  Patient thought it was blood and went to bathroom and noticed it was a large amount of pus.  Pain 9/10, sharp pain.  Febrile on arrival.  Hx autism.  Area is red, hot, hard, and still actively draining pus.

## 2020-04-24 NOTE — ED Notes (Signed)
Pt discharge instructions and prescriptions reviewed the patient. The patient verbalized understanding of instructions. Pt discharged.

## 2020-04-24 NOTE — Discharge Instructions (Addendum)
Call your surgeon's office Monday for follow up Monday or Tuesday. Remove packing on Tuesday if not seen by surgery by then.  Take Doxycycline as prescribed and complete the full course. Apply warm compresses to area for 20 minutes at a time.  Take Norco as needed as prescribed for pain.  Return to the ER for any worsening or concerning symptoms.

## 2020-04-24 NOTE — ED Provider Notes (Signed)
[facility] EMERGENCY DEPARTMENT Provider Note   CSN: [id number] Arrival date & time: 04/24/20  0516     History Chief Complaint  Patient presents with   Abscess   Fever    Tyler Cunningham is a 19 y.o. male.  19 year old male brought in by mom for pilonidal abscess. Patient had surgery with Dr. [name] in July for removal of the pilonidal abscess and area had healed well. Patient states he has had pain in the area for the past few days, rolled over in bed this morning and area started draining. Patient denies fevers, chills, sweats or feeling unwell. Temp of 100.4 in triage, patient states his baseline temp is 100.0 and he does not feel sick.         Past Medical History:  Diagnosis Date   Anxiety    Asthma    Autism    Depression     Patient Active Problem List   Diagnosis Date Noted   ODD (oppositional defiant disorder) 04/19/2014   Autistic spectrum disorder 08/27/2013    Class: Chronic   PTSD (post-traumatic stress disorder) 08/27/2013    Class: Acute   MDD (major depressive disorder), recurrent episode, moderate (HCC) 08/26/2013    Past Surgical History:  Procedure Laterality Date   PILONIDAL CYST EXCISION N/A 12/29/2019   Procedure: PILONIDAL CYSTECTOMY;  Surgeon: Manus Rudd, MD;  Location: [facility];  Service: General;  Laterality: N/A;   PYLOROPLASTY         History reviewed. No pertinent family history.  Social History   Tobacco Use   Smoking status: Current Every Day Smoker    Packs/day: 0.25    Types: Cigarettes   Smokeless tobacco: Never Used  Vaping Use   Vaping Use: Never used  Substance Use Topics   Alcohol use: No    Comment: Denies; UDS + for alcohol    Drug use: No    Home Medications Prior to Admission medications   Medication Sig Start Date End Date Taking? Authorizing Provider  acetaminophen (TYLENOL) 500 MG tablet Take 1,000 mg by mouth every 8 (eight) hours as needed for  moderate pain.   Yes [provider]  albuterol (PROVENTIL HFA;VENTOLIN HFA) 108 (90 BASE) MCG/ACT inhaler Inhale 2 puffs into the lungs every 6 (six) hours as needed. For wheezing.  Patient may resume home supply. 09/01/13  Yes Winson, Louie Bun, NP  buPROPion (WELLBUTRIN XL) 150 MG 24 hr tablet Take 150 mg by mouth every morning. 04/20/20  Yes [provider]  cloNIDine (CATAPRES) 0.1 MG tablet Take 0.1 mg by mouth 2 (two) times daily.   Yes [provider]  ibuprofen (ADVIL) 200 MG tablet Take 600 mg by mouth every 6 (six) hours as needed for moderate pain.   Yes [provider]  venlafaxine XR (EFFEXOR-XR) 150 MG 24 hr capsule Take 150 mg by mouth daily with breakfast.   Yes [provider]  doxycycline (VIBRAMYCIN) 100 MG capsule Take 1 capsule (100 mg total) by mouth 2 (two) times daily. 04/24/20   Jeannie Fend, PA-C  HYDROcodone-acetaminophen (NORCO/VICODIN) 5-325 MG tablet Take 1 tablet by mouth every 4 (four) hours as needed. 04/24/20   Jeannie Fend, PA-C  oxyCODONE (OXY IR/ROXICODONE) 5 MG immediate release tablet Take 1 tablet (5 mg total) by mouth every 6 (six) hours as needed for severe pain. Patient not taking: Reported on 04/24/2020 12/29/19   Manus Rudd, MD    Allergies  Other and Pollen extract  Review of Systems   Review of Systems  Constitutional: Positive for fever. Negative for chills and diaphoresis.  Gastrointestinal: Negative for abdominal pain, nausea and vomiting.  Genitourinary: Negative for scrotal swelling and testicular pain.  Musculoskeletal: Negative for back pain.  Skin: Positive for wound.  Allergic/Immunologic: Negative for immunocompromised state.  Hematological: Negative for adenopathy.  All other systems reviewed and are negative.   Physical Exam Updated Vital Signs BP 125/77 (BP Location: Right Arm)    Pulse (!) 123    Temp 99.1 F (37.3 C) (Oral)    Resp 16    Ht 5\' 10"  (1.778 m)    Wt (!) 149.7 kg     SpO2 98%    BMI 47.35 kg/m   Physical Exam Vitals and nursing note reviewed.  Constitutional:      General: He is not in acute distress.    Appearance: He is well-developed. He is obese. He is not ill-appearing, toxic-appearing or diaphoretic.  HENT:     Head: Normocephalic and atraumatic.  Cardiovascular:     Rate and Rhythm: Tachycardia present.  Pulmonary:     Effort: Pulmonary effort is normal.  Skin:    General: Skin is warm and dry.     Findings: Erythema present.       Neurological:     Mental Status: He is alert and oriented to person, place, and time.  Psychiatric:        Behavior: Behavior normal.     ED Results / Procedures / Treatments   Labs (all labs ordered are listed, but only abnormal results are displayed) Labs Reviewed  COMPREHENSIVE METABOLIC PANEL - Abnormal; Notable for the following components:      Result Value   Glucose, Bld 151 (*)    AST 14 (*)    ALT 51 (*)    All other components within normal limits  CBC WITH DIFFERENTIAL/PLATELET - Abnormal; Notable for the following components:   WBC 15.2 (*)    Platelets 455 (*)    Neutro Abs 10.8 (*)    All other components within normal limits  URINALYSIS, ROUTINE W REFLEX MICROSCOPIC - Abnormal; Notable for the following components:   Protein, ur 30 (*)    All other components within normal limits  CULTURE, BLOOD (ROUTINE X 2)  CULTURE, BLOOD (ROUTINE X 2)  LACTIC ACID, PLASMA  PROTIME-INR    EKG None  Radiology No results found.  Procedures . Incision and Drainage  Date/Time: 04/24/2020 7:44 AM Performed by: 13/12/2019, PA-C Authorized by: Jeannie Fend, PA-C   Consent:    Consent obtained:  Verbal   Consent given by:  Patient   Risks discussed:  Bleeding, incomplete drainage, pain and damage to other organs   Alternatives discussed:  No treatment Universal protocol:    Procedure explained and questions answered to patient or proxy's satisfaction: yes     Relevant  documents present and verified: yes     Test results available and properly labeled: yes     Imaging studies available: yes     Required blood products, implants, devices, and special equipment available: yes     Site/side marked: yes     Immediately prior to procedure a time out was called: yes     Patient identity confirmed:  Verbally with patient Location:    Type:  Pilonidal cyst   Size:  2cm x 2cm   Location:  Anogenital   Anogenital location:  Pilonidal Pre-procedure  details:    Skin preparation:  Betadine Anesthesia (see MAR for exact dosages):    Anesthesia method:  Local infiltration   Local anesthetic:  Lidocaine 1% w/o epi Procedure type:    Complexity:  Complex Procedure details:    Incision types:  Single straight   Incision depth:  Subcutaneous   Scalpel blade:  11   Wound management:  Probed and deloculated, irrigated with saline and extensive cleaning   Drainage:  Purulent   Drainage amount:  Moderate   Packing materials:  1/4 in gauze   Amount 1/4":  3" Post-procedure details:    Patient tolerance of procedure:  Tolerated well, no immediate complications   (including critical care time)  Medications Ordered in ED Medications  lidocaine (PF) (XYLOCAINE) 1 % injection 5 mL (5 mLs Infiltration Given 04/24/20 0740)  HYDROcodone-acetaminophen (NORCO/VICODIN) 5-325 MG per tablet 2 tablet (2 tablets Oral Given 04/24/20 0700)    ED Course  I have reviewed the triage vital signs and the nursing notes.  Pertinent labs & imaging results that were available during my care of the patient were reviewed by me and considered in my medical decision making (see chart for details).  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 Febrile on arrival with temp 100.4, 100.6 on recheck in the room shortly after arrival, HR initially 142, has improved to 120 at time of exam. Area suctioned, I&D with further drainage,  wound packed. Patient placed on Doxycycline and advised to follow up with surgery for recheck, remove packing Tuesday if he has not seen surgery by then. Labs ordered due to fever and tachycardia, mildly elevated WBC to 15.2, CMP without significant changes, UA without infection, lactic normal. Doubt sepsis at this time, patient well appearing, treated with I&D and oral antibiotics. Return precautions given.    [LM]    Clinical Course User Index [LM] Alden Hipp   MDM Rules/Calculators/A&P                          Final Clinical Impression(s) / ED Diagnoses Final diagnoses:  Pilonidal abscess    Rx / DC Orders ED Discharge Orders         Ordered    doxycycline (VIBRAMYCIN) 100 MG capsule  2 times daily        04/24/20 0732    HYDROcodone-acetaminophen (NORCO/VICODIN) 5-325 MG tablet  Every 4 hours PRN        04/24/20 0732           Jeannie Fend, PA-C 04/24/20 0747    Gwyneth Sprout, MD 04/24/20 [phone]

## 2020-04-29 LAB — CULTURE, BLOOD (ROUTINE X 2)
Culture: NO GROWTH
Culture: NO GROWTH
Special Requests: ADEQUATE
Special Requests: ADEQUATE

## 2020-06-23 ENCOUNTER — Emergency Department (HOSPITAL_COMMUNITY)
Admission: EM | Admit: 2020-06-23 | Discharge: 2020-06-23 | Disposition: A | Discharge status: Home / Self Care | Payer: Medicare Other | Attending: Emergency Medicine | Admitting: Emergency Medicine

## 2020-06-23 ENCOUNTER — Other Ambulatory Visit: Payer: Self-pay

## 2020-06-23 DIAGNOSIS — R197 Diarrhea, unspecified: Secondary | ICD-10-CM | POA: Diagnosis not present

## 2020-06-23 DIAGNOSIS — R109 Unspecified abdominal pain: Secondary | ICD-10-CM | POA: Insufficient documentation

## 2020-06-23 DIAGNOSIS — R059 Cough, unspecified: Secondary | ICD-10-CM | POA: Insufficient documentation

## 2020-06-23 DIAGNOSIS — J45909 Unspecified asthma, uncomplicated: Secondary | ICD-10-CM | POA: Diagnosis not present

## 2020-06-23 DIAGNOSIS — Z5321 Procedure and treatment not carried out due to patient leaving prior to being seen by health care provider: Secondary | ICD-10-CM | POA: Insufficient documentation

## 2020-06-23 LAB — COMPREHENSIVE METABOLIC PANEL
ALT: 58 U/L — ABNORMAL HIGH (ref 0–44)
AST: 21 U/L (ref 15–41)
Albumin: 3.9 g/dL (ref 3.5–5.0)
Alkaline Phosphatase: 56 U/L (ref 38–126)
Anion gap: 9 (ref 5–15)
BUN: 9 mg/dL (ref 6–20)
CO2: 23 mmol/L (ref 22–32)
Calcium: 9.2 mg/dL (ref 8.9–10.3)
Chloride: 108 mmol/L (ref 98–111)
Creatinine, Ser: 0.85 mg/dL (ref 0.61–1.24)
GFR, Estimated: >60 mL/min (ref >60)
Glucose, Bld: 83 mg/dL (ref 70–99)
Potassium: 4.6 mmol/L (ref 3.5–5.1)
Sodium: 140 mmol/L (ref 135–145)
Total Bilirubin: 0.4 mg/dL (ref 0.3–1.2)
Total Protein: 7.6 g/dL (ref 6.5–8.1)

## 2020-06-23 LAB — CBC
HCT: 46.2 % (ref 39.0–52.0)
Hemoglobin: 15.9 g/dL (ref 13.0–17.0)
MCH: 30.6 pg (ref 26.0–34.0)
MCHC: 34.4 g/dL (ref 30.0–36.0)
MCV: 89 fL (ref 80.0–100.0)
Platelets: 408 10*3/uL — ABNORMAL HIGH (ref 150–400)
RBC: 5.19 MIL/uL (ref 4.22–5.81)
RDW: 12.8 % (ref 11.5–15.5)
WBC: 10 10*3/uL (ref 4.0–10.5)
nRBC: 0 % (ref 0.0–0.2)

## 2020-06-23 LAB — LIPASE, BLOOD: Lipase: 27 U/L (ref 11–51)

## 2020-06-23 NOTE — ED Triage Notes (Signed)
Pt reports abd pain x1 week, diarrhea x2 days. Denies fever. Reports cough due to asthma.  Reports pain is dull, decreases after eating.

## 2020-06-24 ENCOUNTER — Ambulatory Visit
Admission: EM | Admit: 2020-06-24 | Discharge: 2020-06-24 | Disposition: A | Discharge status: Home / Self Care | Payer: Medicare Other | Attending: Emergency Medicine | Admitting: Emergency Medicine

## 2020-06-24 ENCOUNTER — Encounter: Payer: Self-pay | Admitting: Emergency Medicine

## 2020-06-24 DIAGNOSIS — R1013 Epigastric pain: Secondary | ICD-10-CM

## 2020-06-24 MED ORDER — SUCRALFATE 1 GM/10ML PO SUSP: 1.0000 g | Freq: Three times a day (TID) | ORAL | 0 refills | Status: AC

## 2020-06-24 MED ORDER — FAMOTIDINE 20 MG PO TABS: 20.0000 mg | ORAL_TABLET | Freq: Two times a day (BID) | ORAL | 0 refills | Status: AC

## 2020-06-24 MED ORDER — LIDOCAINE VISCOUS HCL 2 % MT SOLN
15.0000 mL | Freq: Once | OROMUCOSAL | Status: AC
  Administered 2020-06-24: 15 mL via ORAL

## 2020-06-24 MED ORDER — ONDANSETRON 4 MG PO TBDP: 4.0000 mg | ORAL_TABLET | Freq: Three times a day (TID) | ORAL | 0 refills | Status: DC | PRN

## 2020-06-24 MED ORDER — OMEPRAZOLE 20 MG PO CPDR: 20.0000 mg | DELAYED_RELEASE_CAPSULE | Freq: Two times a day (BID) | ORAL | 0 refills | Status: DC

## 2020-06-24 MED ORDER — ALUM & MAG HYDROXIDE-SIMETH 200-200-20 MG/5ML PO SUSP
30.0000 mL | Freq: Once | ORAL | Status: AC
  Administered 2020-06-24: 30 mL via ORAL

## 2020-06-24 NOTE — ED Triage Notes (Signed)
Patient c/o ABD pain x 4 days.   Patient endorses that pain flare up after eating.   Patient endorses dark stools at home.   Patient has taken Mylanta and Prilosec, medications have helped with gas.

## 2020-06-24 NOTE — ED Provider Notes (Signed)
[facility]    CSN: [id number] Arrival date & time: 06/24/20  1000      History   Chief Complaint Chief Complaint  Patient presents with  . Abdominal Pain    HPI Tyler Cunningham is a 20 y.o. male history of autism, presenting today for evaluation of abdominal pain.  Reports darker stools at home.  Using Mylanta and Prilosec. Reports a lot of pressure and dull pain, Pain persistent. Improves briefly with eating, then returns. Improves with cold fluids.  Family history of ulcers and gallbladder problems.  History of pyloric stenosis when younger.  Upon chart review is at [facility] emergency room yesterday and had blood work taking which was relatively unremarkable, white count normal, LFTs overall reassuring, ALT slightly elevated at 58.    HPI  Past Medical History:  Diagnosis Date  . Anxiety   . Asthma   . Autism   . Depression     Patient Active Problem List   Diagnosis Date Noted  . ODD (oppositional defiant disorder) 04/19/2014  . Autistic spectrum disorder 08/27/2013    Class: Chronic  . PTSD (post-traumatic stress disorder) 08/27/2013    Class: Acute  . MDD (major depressive disorder), recurrent episode, moderate ([place]) 08/26/2013    Past Surgical History:  Procedure Laterality Date  . PILONIDAL CYST EXCISION N/A 12/29/2019   Procedure: PILONIDAL CYSTECTOMY;  Surgeon: Donnie Mesa, MD;  Location: [place];  Service: General;  Laterality: N/A;  . PYLOROPLASTY         Home Medications    Prior to Admission medications   Medication Sig Start Date End Date Taking? Authorizing Provider  famotidine (PEPCID) 20 MG tablet Take 1 tablet (20 mg total) by mouth 2 (two) times daily. 06/24/20  Yes Keyunna Coco C, PA-C  omeprazole (PRILOSEC) 20 MG capsule Take 1 capsule (20 mg total) by mouth 2 (two) times daily before a meal. 06/24/20  Yes Armstrong Creasy C, PA-C  ondansetron (ZOFRAN ODT) 4 MG disintegrating tablet Take 1 tablet (4 mg  total) by mouth every 8 (eight) hours as needed for nausea or vomiting. 06/24/20  Yes Jermar Colter C, PA-C  sucralfate (CARAFATE) 1 GM/10ML suspension Take 10 mLs (1 g total) by mouth 4 (four) times daily -  with meals and at bedtime. 06/24/20  Yes Tinaya Ceballos C, PA-C  acetaminophen (TYLENOL) 500 MG tablet Take 1,000 mg by mouth every 8 (eight) hours as needed for moderate pain.    [provider]  albuterol (PROVENTIL HFA;VENTOLIN HFA) 108 (90 BASE) MCG/ACT inhaler Inhale 2 puffs into the lungs every 6 (six) hours as needed. For wheezing.  Patient may resume home supply. 09/01/13   Winson, Manus Rudd, NP  buPROPion (WELLBUTRIN XL) 150 MG 24 hr tablet Take 150 mg by mouth every morning. 04/20/20   [provider]  cloNIDine (CATAPRES) 0.1 MG tablet Take 0.1 mg by mouth 2 (two) times daily.    [provider]  doxycycline (VIBRAMYCIN) 100 MG capsule Take 1 capsule (100 mg total) by mouth 2 (two) times daily. 04/24/20   Tacy Learn, PA-C  HYDROcodone-acetaminophen (NORCO/VICODIN) 5-325 MG tablet Take 1 tablet by mouth every 4 (four) hours as needed. 04/24/20   Tacy Learn, PA-C  ibuprofen (ADVIL) 200 MG tablet Take 600 mg by mouth every 6 (six) hours as needed for moderate pain.    [provider]  oxyCODONE (OXY IR/ROXICODONE) 5 MG immediate release tablet Take 1 tablet (5 mg total) by  mouth every 6 (six) hours as needed for severe pain. Patient not taking: Reported on 04/24/2020 12/29/19   Manus Rudd, MD  venlafaxine XR (EFFEXOR-XR) 150 MG 24 hr capsule Take 150 mg by mouth daily with breakfast.    [provider]    Family History History reviewed. No pertinent family history.  Social History Social History   Tobacco Use  . Smoking status: Current Every Day Smoker    Packs/day: 0.25    Types: Cigarettes  . Smokeless tobacco: Never Used  Vaping Use  . Vaping Use: Never used  Substance Use Topics  . Alcohol use: No    Comment: Denies; UDS +  for alcohol   . Drug use: No     Allergies   Other and Pollen extract   Review of Systems Review of Systems  Constitutional: Negative for activity change, appetite change, chills, fatigue and fever.  HENT: Negative for congestion, ear pain, rhinorrhea, sinus pressure, sore throat and trouble swallowing.   Eyes: Negative for discharge and redness.  Respiratory: Negative for cough, chest tightness and shortness of breath.   Cardiovascular: Negative for chest pain.  Gastrointestinal: Positive for abdominal pain and nausea. Negative for diarrhea and vomiting.  Musculoskeletal: Negative for myalgias.  Skin: Negative for rash.  Neurological: Negative for dizziness, light-headedness and headaches.     Physical Exam Triage Vital Signs ED Triage Vitals  Enc Vitals Group     BP 06/24/20 1038 120/80     Pulse Rate 06/24/20 1038 94     Resp 06/24/20 1038 16     Temp 06/24/20 1038 98.1 F (36.7 C)     Temp Source 06/24/20 1038 Oral     SpO2 06/24/20 1038 95 %     Weight 06/24/20 1033 (!) 315 lb (142.9 kg)     Height 06/24/20 1033 5\' 10"  (1.778 m)     Head Circumference --      Peak Flow --      Pain Score 06/24/20 1032 3     Pain Loc --      Pain Edu? --      Excl. in GC? --    No data found.  Updated Vital Signs BP 120/80 (BP Location: Right Arm)   Pulse 94   Temp 98.1 F (36.7 C) (Oral)   Resp 16   Ht 5\' 10"  (1.778 m)   Wt (!) 315 lb (142.9 kg)   SpO2 95%   BMI 45.20 kg/m   Visual Acuity Right Eye Distance:   Left Eye Distance:   Bilateral Distance:    Right Eye Near:   Left Eye Near:    Bilateral Near:     Physical Exam Vitals and nursing note reviewed.  Constitutional:      Appearance: He is well-developed and well-nourished.     Comments: No acute distress  HENT:     Head: Normocephalic and atraumatic.     Nose: Nose normal.  Eyes:     Conjunctiva/sclera: Conjunctivae normal.  Cardiovascular:     Rate and Rhythm: Normal rate and regular rhythm.   Pulmonary:     Effort: Pulmonary effort is normal. No respiratory distress.     Comments: Breathing comfortably at rest, CTABL, no wheezing, rales or other adventitious sounds auscultated Abdominal:     General: There is no distension.     Comments: Well-healed scar noted to right upper quadrant, tender to palpation to epigastrium and right lower quadrant, negative rebound, negative Rovsing, negative McBurney's, negative Murphy's  Musculoskeletal:        General: Normal range of motion.     Cervical back: Neck supple.  Skin:    General: Skin is warm and dry.  Neurological:     Mental Status: He is alert and oriented to person, place, and time.  Psychiatric:        Mood and Affect: Mood and affect normal.      UC Treatments / Results  Labs (all labs ordered are listed, but only abnormal results are displayed) Labs Reviewed - No data to display  EKG   Radiology No results found.  Procedures Procedures (including critical care time)  Medications Ordered in UC Medications  alum & mag hydroxide-simeth (MAALOX/MYLANTA) 200-200-20 MG/5ML suspension 30 mL (30 mLs Oral Given 06/24/20 1128)    And  lidocaine (XYLOCAINE) 2 % viscous mouth solution 15 mL (15 mLs Oral Given 06/24/20 1128)    Initial Impression / Assessment and Plan / UC Course  I have reviewed the triage vital signs and the nursing notes.  Pertinent labs & imaging results that were available during my care of the patient were reviewed by me and considered in my medical decision making (see chart for details).     GI cocktail provided with slight improvement in discomfort in abdomen, recommending to continue treatment for underlying gastritis/possible ulcer omeprazole daily, supplement with Pepcid, Zofran as needed, recommended to follow-up with primary care/gastroenterology symptoms persisting.  May benefit from EGD.  Discussed strict return precautions. Patient verbalized understanding and is agreeable with  plan.  Final Clinical Impressions(s) / UC Diagnoses   Final diagnoses:  Epigastric pain     Discharge Instructions     Please use omeprazole/Prilosec twice daily for the next 2 weeks May supplement with Pepcid as needed for underlying abdominal discomfort Carafate before meals and bedtime to help with underlying possible ulcer Zofran as needed for nausea Please follow-up with primary care/gastroenterology symptoms persisting If symptoms worsening, developing worsening pain or bloody stools, please go to the emergency room    ED Prescriptions    Medication Sig Dispense Auth. Provider   omeprazole (PRILOSEC) 20 MG capsule Take 1 capsule (20 mg total) by mouth 2 (two) times daily before a meal. 30 capsule Marquisa Salih C, PA-C   famotidine (PEPCID) 20 MG tablet Take 1 tablet (20 mg total) by mouth 2 (two) times daily. 30 tablet Bemnet Trovato C, PA-C   sucralfate (CARAFATE) 1 GM/10ML suspension Take 10 mLs (1 g total) by mouth 4 (four) times daily -  with meals and at bedtime. 420 mL Wonda Goodgame C, PA-C   ondansetron (ZOFRAN ODT) 4 MG disintegrating tablet Take 1 tablet (4 mg total) by mouth every 8 (eight) hours as needed for nausea or vomiting. 20 tablet Eldredge Veldhuizen, [place] C, PA-C     PDMP not reviewed this encounter.   Lew Dawes, [place] 06/24/20 1207

## 2020-06-24 NOTE — Discharge Instructions (Signed)
Please use omeprazole/Prilosec twice daily for the next 2 weeks May supplement with Pepcid as needed for underlying abdominal discomfort Carafate before meals and bedtime to help with underlying possible ulcer Zofran as needed for nausea Please follow-up with primary care/gastroenterology symptoms persisting If symptoms worsening, developing worsening pain or bloody stools, please go to the emergency room

## 2020-09-21 ENCOUNTER — Ambulatory Visit (INDEPENDENT_AMBULATORY_CARE_PROVIDER_SITE_OTHER): Payer: Medicare Other | Admitting: Gastroenterology

## 2020-09-21 ENCOUNTER — Encounter: Payer: Self-pay | Admitting: Gastroenterology

## 2020-09-21 ENCOUNTER — Other Ambulatory Visit: Payer: Self-pay

## 2020-09-21 ENCOUNTER — Other Ambulatory Visit (INDEPENDENT_AMBULATORY_CARE_PROVIDER_SITE_OTHER): Payer: Medicare Other

## 2020-09-21 VITALS — BP 90/66 | HR 105 | Ht 70.0 in | Wt 338.0 lb

## 2020-09-21 DIAGNOSIS — R197 Diarrhea, unspecified: Secondary | ICD-10-CM | POA: Diagnosis not present

## 2020-09-21 DIAGNOSIS — K219 Gastro-esophageal reflux disease without esophagitis: Secondary | ICD-10-CM

## 2020-09-21 DIAGNOSIS — R110 Nausea: Secondary | ICD-10-CM | POA: Diagnosis not present

## 2020-09-21 DIAGNOSIS — R630 Anorexia: Secondary | ICD-10-CM | POA: Diagnosis not present

## 2020-09-21 DIAGNOSIS — R1013 Epigastric pain: Secondary | ICD-10-CM

## 2020-09-21 DIAGNOSIS — R1310 Dysphagia, unspecified: Secondary | ICD-10-CM

## 2020-09-21 LAB — COMPREHENSIVE METABOLIC PANEL
ALT: 47 U/L (ref 0–53)
AST: 16 U/L (ref 0–37)
Albumin: 4.2 g/dL (ref 3.5–5.2)
Alkaline Phosphatase: 63 U/L (ref 39–117)
BUN: 13 mg/dL (ref 6–23)
CO2: 28 mEq/L (ref 19–32)
Calcium: 9.6 mg/dL (ref 8.4–10.5)
Chloride: 101 mEq/L (ref 96–112)
Creatinine, Ser: 1.05 mg/dL (ref 0.40–1.50)
GFR: 101.06 mL/min (ref >60.00)
Glucose, Bld: 71 mg/dL (ref 70–99)
Potassium: 3.9 mEq/L (ref 3.5–5.1)
Sodium: 137 mEq/L (ref 135–145)
Total Bilirubin: 0.3 mg/dL (ref 0.2–1.2)
Total Protein: 7.3 g/dL (ref 6.0–8.3)

## 2020-09-21 LAB — LIPASE: Lipase: 37 U/L (ref 11.0–59.0)

## 2020-09-21 LAB — CBC
HCT: 42.7 % (ref 39.0–52.0)
Hemoglobin: 15 g/dL (ref 13.0–17.0)
MCHC: 35.2 g/dL (ref 30.0–36.0)
MCV: 86.3 fl (ref 78.0–100.0)
Platelets: 412 10*3/uL — ABNORMAL HIGH (ref 150.0–400.0)
RBC: 4.95 Mil/uL (ref 4.22–5.81)
RDW: 13.3 % (ref 11.5–15.5)
WBC: 11.5 10*3/uL — ABNORMAL HIGH (ref 4.0–10.5)

## 2020-09-21 LAB — TSH: TSH: 2.52 u[IU]/mL (ref 0.35–4.50)

## 2020-09-21 LAB — CORTISOL: Cortisol, Plasma: 3 ug/dL

## 2020-09-21 MED ORDER — HYOSCYAMINE SULFATE 0.125 MG SL SUBL: 0.1250 mg | SUBLINGUAL_TABLET | SUBLINGUAL | 2 refills | Status: AC | PRN

## 2020-09-21 MED ORDER — SUPREP BOWEL PREP KIT 17.5-3.13-1.6 GM/177ML PO SOLN: 1.0000 | ORAL | 0 refills | Status: DC

## 2020-09-21 MED ORDER — OMEPRAZOLE 40 MG PO CPDR: 40.0000 mg | DELAYED_RELEASE_CAPSULE | Freq: Two times a day (BID) | ORAL | 2 refills | Status: DC

## 2020-09-21 MED ORDER — PROMETHAZINE HCL 12.5 MG PO TABS: 12.5000 mg | ORAL_TABLET | Freq: Three times a day (TID) | ORAL | 1 refills | Status: AC | PRN

## 2020-09-21 NOTE — Patient Instructions (Signed)
Your provider has requested that you go to the basement level for lab work before leaving today. Press "B" on the elevator. The lab is located at the first door on the left as you exit the elevator.  You have been scheduled for an endoscopy and colonoscopy. Please follow the written instructions given to you at your visit today. Please pick up your prep supplies at the pharmacy within the next 1-3 days. If you use inhalers (even only as needed), please bring them with you on the day of your procedure.   We have sent the following medications to your pharmacy for you to pick up at your convenience: Suprep, Omeprazole, Phenergan, and Levsin    You have been scheduled for an abdominal ultrasound at [facility] (1st floor of hospital) on 10/05/20 at 8:30am. Please arrive 15 minutes prior to your appointment for registration. Make certain not to have anything to eat or drink 6 hours prior to your appointment. Should you need to reschedule your appointment, please contact radiology at [phone]. This test typically takes about 30 minutes to perform.   Due to recent changes in healthcare laws, you may see the results of your imaging and laboratory studies on MyChart before your provider has had a chance to review them.  We understand that in some cases there may be results that are confusing or concerning to you. Not all laboratory results come back in the same time frame and the provider may be waiting for multiple results in order to interpret others.  Please give [place] 48 hours in order for your provider to thoroughly review all the results before contacting the office for clarification of your results.     Thank you for choosing me and [facility].  Dr. Meridee Score

## 2020-09-22 LAB — IGA: Immunoglobulin A: 131 mg/dL (ref 47–310)

## 2020-09-22 LAB — TISSUE TRANSGLUTAMINASE, IGA: (tTG) Ab, IgA: <1 U/mL

## 2020-09-23 ENCOUNTER — Other Ambulatory Visit: Payer: Self-pay

## 2020-09-23 DIAGNOSIS — R1013 Epigastric pain: Secondary | ICD-10-CM

## 2020-09-23 NOTE — Progress Notes (Signed)
GASTROENTEROLOGY OUTPATIENT CLINIC VISIT   Primary Care Provider [name], MD [address] [place] [address] [phone]  Referring Provider [name], MD [address] [place],  [place] [address] [phone]  Patient Profile: Tyler Cunningham is a 19 y.o. 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  The patient presents to the [facility] for an evaluation and management of problem(s) noted below:  Problem List 1. Abdominal pain, epigastric   2. Gastroesophageal reflux disease, unspecified whether esophagitis present   3. Nausea without vomiting   4. Anorexia   5. Diarrhea, unspecified type   6. Dysphagia, unspecified type   7. Odynophagia     History of Present Illness This is the patient's first visit to the outpatient [place] clinic.  He has never seen a gastroenterologist.  He has had years of GI issues.  This is confirmed by his mother who accompanies him today.  Patient has abdominal pain in the mid abdomen that occurs all the time.  Usually worsens within 45 minutes to 3 hours after eating.  The pain is sharp and stabbing as if the razor blades are cutting him.  He has acid reflux that has been an issue for years.  He has been on Prevacid as well as Zantac as well as Prilosec as well as Carafate in the past.  He has difficulty swallowing occasionally solid foods but a bigger issue is odynophagia..  No issues with liquids.  Hot liquids actually are better on him.  He has nausea frequently without vomiting.  No hematemesis or coffee-ground emesis history.  In the course of 1 year he lost 12 pounds but things have been relatively stable overall for the last few months.  He describes daily bowel movements for years however in the last 6 months he is now having every other day diarrhea.  There is a family history  of a maternal great-grandmother with colon cancer but no first or second-degree relatives.  The patient has never had an upper or lower endoscopy.  He has not had imaging studies or stool studies performed at any time point.  GI Review of Systems Positive as above including at times bloating Negative for melena, hematochezia  Review of Systems General: Denies fevers/chills HEENT: Denies oral lesions Cardiovascular: Denies chest pain/palpitations Pulmonary: Denies shortness of breath Gastroenterological: See HPI Genitourinary: Denies darkened urine or hematuria Hematological: Denies easy bruising/bleeding Endocrine: Denies temperature intolerance Dermatological: Denies jaundice Psychological: Mood is stable   Medications Current Outpatient Medications  Medication Sig Dispense Refill  . acetaminophen (TYLENOL) 500 MG tablet Take 1,000 mg by mouth every 8 (eight) hours as needed for moderate pain.    [name] albuterol (PROVENTIL HFA;VENTOLIN HFA) 108 (90 BASE) MCG/ACT inhaler Inhale 2 puffs into the lungs every 6 (six) hours as needed. For wheezing.  Patient may resume home supply.    [name] buPROPion (WELLBUTRIN XL) 150 MG 24 hr tablet Take 150 mg by mouth every morning.    . cloNIDine (CATAPRES) 0.1 MG tablet Take 0.1 mg by mouth 2 (two) times daily.    . famotidine (PEPCID) 20 MG tablet Take 1 tablet (20 mg total) by mouth 2 (two) times daily. 30 tablet 0  . hyoscyamine (LEVSIN SL) 0.125 MG SL tablet Place 1 tablet (0.125 mg total) under the tongue every 4 (four) hours as needed. 30 tablet 2  . ibuprofen (  ADVIL) 200 MG tablet Take 600 mg by mouth every 6 (six) hours as needed for moderate pain.    . Na Sulfate-K Sulfate-Mg Sulf (SUPREP BOWEL PREP KIT) 17.5-3.13-1.6 GM/177ML SOLN Take 1 kit by mouth as directed. For colonoscopy prep 354 mL 0  . omeprazole (PRILOSEC) 40 MG capsule Take 1 capsule (40 mg total) by mouth 2 (two) times daily. 60 capsule 2  . promethazine (PHENERGAN) 12.5 MG tablet Take  1 tablet (12.5 mg total) by mouth every 8 (eight) hours as needed for nausea or vomiting. 30 tablet 1  . sucralfate (CARAFATE) 1 GM/10ML suspension Take 10 mLs (1 g total) by mouth 4 (four) times daily -  with meals and at bedtime. 420 mL 0  . venlafaxine XR (EFFEXOR-XR) 150 MG 24 hr capsule Take 150 mg by mouth daily with breakfast.     No current facility-administered medications for this visit.    Allergies Allergies  Allergen Reactions  . Other Other (See Comments)    Rondec Drops caused lethargy and could not wake up  (chlorpheniramine, dextromethorphan, and Phenylephrine)   . Pollen Extract Other (See Comments)    Runny nose and other allergic symptoms    Histories Past Medical History:  Diagnosis Date  . Anxiety   . Asthma   . Autism   . Depression    Past Surgical History:  Procedure Laterality Date  . PILONIDAL CYST EXCISION N/A 12/29/2019   Procedure: PILONIDAL CYSTECTOMY;  Surgeon: Donnie Mesa, MD;  Location: [place];  Service: General;  Laterality: N/A;  . PYLOROPLASTY     Social History   Socioeconomic History  . Marital status: Single    Spouse name: Not on file  . Number of children: Not on file  . Years of education: Not on file  . Highest education level: Not on file  Occupational History  . Not on file  Tobacco Use  . Smoking status: Current Every Day Smoker    Packs/day: 0.25    Types: Cigarettes  . Smokeless tobacco: Never Used  Vaping Use  . Vaping Use: Never used  Substance and Sexual Activity  . Alcohol use: No    Comment: Denies; UDS + for alcohol   . Drug use: No  . Sexual activity: Never    Birth control/protection: Abstinence  Other Topics Concern  . Not on file  Social History Narrative  . Not on file   Social Determinants of Health   Financial Resource Strain: Not on file  Food Insecurity: Not on file  Transportation Needs: Not on file  Physical Activity: Not on file  Stress: Not on file  Social  Connections: Not on file  Intimate Partner Violence: Not on file   Family History  Problem Relation Age of Onset  . Breast cancer Mother   . Lupus Mother   . Colon polyps Mother   . Heart disease Father   . Colon cancer Maternal Grandmother   . Heart disease Maternal Grandmother   . Diabetes Maternal Grandfather   . Heart disease Maternal Grandfather   . Heart disease Paternal Grandmother   . Diabetes Paternal Grandfather   . Heart disease Paternal Grandfather   . Inflammatory bowel disease Neg Hx   . Liver disease Neg Hx   . Pancreatic cancer Neg Hx   . Stomach cancer Neg Hx    I have reviewed his medical, social, and family history in detail and updated the electronic medical record as necessary.    PHYSICAL  EXAMINATION  BP 90/66   Pulse (!) 105   Ht _0  (1.778 m)   Wt (!) 338 lb (153.3 kg)   BMI 48.50 kg/m  Wt Readings from Last 3 Encounters:  09/21/20 (!) 338 lb (153.3 kg)  06/24/20 (!) 315 lb (142.9 kg)  04/24/20 (!) 330 lb (149.7 kg)  GEN: NAD, appears stated age, doesn't appear chronically ill, accompanied by mother PSYCH: Cooperative, without pressured speech EYE: Conjunctivae pink, sclerae anicteric ENT: MMM CV: Nontachycardic RESP: No audible wheezing GI: NABS, soft, obese, rounded, without rebound or guarding, unable to appreciate hepatosplenomegaly due to body habitus MSK/EXT: No lower extremity edema SKIN: No jaundice NEURO:  Alert & Oriented x 3, no focal deficits   REVIEW OF DATA  I reviewed the following data at the time of this encounter:  GI Procedures and Studies  No relevant studies to review  Laboratory Studies  Reviewed those in epic  Imaging Studies  No relevant studies to review   ASSESSMENT  Mr. Ziller is a 20 y.o. 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  The patient is seen today for evaluation and  management of:  1. Abdominal pain, epigastric   2. Gastroesophageal reflux disease, unspecified whether esophagitis present   3. Nausea without vomiting   4. Anorexia   5. Diarrhea, unspecified type   6. Dysphagia, unspecified type   7. Odynophagia    The patient is hemodynamically stable.  Clinically he has multiple GI issues are relatively longstanding.  We will work with the patient over the course of the coming months in effort of trying to optimize and understand exactly what is causing him issues.  Suspect acid reflux is an issue.  He may have irritable bowel syndrome as well we need to rule out other issues prior to making this a functional disorder.  Laboratories as well as stool studies and endoscopic evaluation are recommended.  We will increase the patient's PPI therapy as well as give him an antiemetic and an antispasmodic.  The risks and benefits of endoscopic evaluation were discussed with the patient; these include but are not limited to the risk of perforation, infection, bleeding, missed lesions, lack of diagnosis, severe illness requiring hospitalization, as well as anesthesia and sedation related illnesses.  The patient is agreeable to proceed.  All patient questions were answered to the best of my ability, and the patient agrees to the aforementioned plan of action with follow-up as indicated.   PLAN  Laboratories as outlined below Stool studies as outlined below Fecal elastase to be obtained Consider SIBO breath testing in future Initiate omeprazole 40 mg twice daily for now Phenergan as needed Levsin every 6 hours as needed Diagnostic endoscopy (esophageal/gastric/duodenal biopsies to be obtained) Diagnostic colonoscopy (TI/colon biopsies to be obtained if possible)   Orders Placed This Encounter  Procedures  . Stool Culture  . Ova and parasite examination  . US Abdomen Complete  . CBC  . Comp Met (CMET)  . Lipase  . TSH  . Cortisol  . Tissue transglutaminase,  IgA  . Clostridium difficile Toxin B, Qualitative, Real-Time PCR  . Pancreatic elastase, fecal  . IgA  . Ambulatory referral to Gastroenterology    New Prescriptions   HYOSCYAMINE (LEVSIN SL) 0.125 MG SL TABLET    Place 1 tablet (0.125 mg total) under the tongue every 4 (four) hours as needed.   NA SULFATE-K SULFATE-MG  SULF (SUPREP BOWEL PREP KIT) 17.5-3.13-1.6 GM/177ML SOLN    Take 1 kit by mouth as directed. For colonoscopy prep   OMEPRAZOLE (PRILOSEC) 40 MG CAPSULE    Take 1 capsule (40 mg total) by mouth 2 (two) times daily.   PROMETHAZINE (PHENERGAN) 12.5 MG TABLET    Take 1 tablet (12.5 mg total) by mouth every 8 (eight) hours as needed for nausea or vomiting.   Modified Medications   No medications on file    Planned Follow Up No follow-ups on file.   Total Time in Face-to-Face and in Coordination of Care for patient including independent/personal interpretation/review of prior testing, medical history, examination, medication adjustment, communicating results with the patient directly, and documentation with the EHR is 55 minutes.   Justice Britain, MD [facility] Office # [phone]

## 2020-09-24 ENCOUNTER — Encounter: Payer: Self-pay | Admitting: Gastroenterology

## 2020-09-24 DIAGNOSIS — R1310 Dysphagia, unspecified: Secondary | ICD-10-CM | POA: Insufficient documentation

## 2020-09-24 DIAGNOSIS — R197 Diarrhea, unspecified: Secondary | ICD-10-CM | POA: Insufficient documentation

## 2020-09-24 DIAGNOSIS — R1013 Epigastric pain: Secondary | ICD-10-CM | POA: Insufficient documentation

## 2020-09-24 DIAGNOSIS — R630 Anorexia: Secondary | ICD-10-CM | POA: Insufficient documentation

## 2020-09-24 DIAGNOSIS — R110 Nausea: Secondary | ICD-10-CM | POA: Insufficient documentation

## 2020-09-24 DIAGNOSIS — K219 Gastro-esophageal reflux disease without esophagitis: Secondary | ICD-10-CM | POA: Insufficient documentation

## 2020-10-05 ENCOUNTER — Ambulatory Visit (HOSPITAL_COMMUNITY)
Admission: RE | Admit: 2020-10-05 | Discharge: 2020-10-05 | Disposition: A | Discharge status: Home / Self Care | Payer: Medicare Other | Source: Ambulatory Visit | Attending: Gastroenterology | Admitting: Gastroenterology

## 2020-10-05 ENCOUNTER — Other Ambulatory Visit: Payer: Self-pay

## 2020-10-05 ENCOUNTER — Ambulatory Visit (HOSPITAL_COMMUNITY): Payer: Medicare Other

## 2020-10-05 DIAGNOSIS — R1013 Epigastric pain: Secondary | ICD-10-CM | POA: Insufficient documentation

## 2020-10-05 DIAGNOSIS — R197 Diarrhea, unspecified: Secondary | ICD-10-CM | POA: Diagnosis present

## 2020-10-05 DIAGNOSIS — R110 Nausea: Secondary | ICD-10-CM | POA: Insufficient documentation

## 2020-10-05 DIAGNOSIS — K219 Gastro-esophageal reflux disease without esophagitis: Secondary | ICD-10-CM | POA: Insufficient documentation

## 2020-10-10 ENCOUNTER — Ambulatory Visit (HOSPITAL_COMMUNITY): Payer: Medicare Other

## 2020-11-22 ENCOUNTER — Encounter: Payer: Medicare Other | Admitting: Gastroenterology

## 2020-11-22 NOTE — Progress Notes (Deleted)
Patient no showed for his EGD/colonoscopy. He will not be required to pay late cancellation fee for this 1 time no-show. Should in the future he reschedule and he missed an appointment he will need a double cancellation fee for 2 procedure slots.  Corliss Parish, MD [facility] Office # [phone]

## 2020-12-04 ENCOUNTER — Telehealth: Payer: Self-pay | Admitting: Gastroenterology

## 2020-12-04 NOTE — Telephone Encounter (Signed)
This is a patient of Dr. Rush Landmark - his mother called through the service this evening questioning what time to give patient AM bowel prep dose, believing patient is scheduled for an ECL with GM tomorrow, 6/20  She had already given Tyler Cunningham his evening prep dose.  Dr. Rush Landmark is out of the office 6/20, and this patient is not on the schedule of any of the docs in [facility] 6/20. So there is some misunderstanding about the procedure date.  I cannot see an active procedure appointment, but it looks like there was an earlier date (? 6/7) that was moved or patient did not attend?Mother says that was the date of abd [place], but that report is dated 10/05/20  I informed her that Tyler Cunningham is not having his procedures 6/20.  Please check with Dr. Lezlie Octave when he returns to the office and contact the patient with plans. (He will need a new bowel prep kit since he took the evening dose today)  - HD

## 2020-12-04 NOTE — Telephone Encounter (Signed)
HD, Thank you for the update. Patient was a no-show for his date of EGD/colonoscopy which was earlier this month. Please schedule him for next available EGD/colonoscopy time slot. Thank you. GM

## 2020-12-06 ENCOUNTER — Other Ambulatory Visit: Payer: Self-pay | Admitting: Gastroenterology

## 2020-12-06 DIAGNOSIS — R1013 Epigastric pain: Secondary | ICD-10-CM

## 2020-12-06 DIAGNOSIS — R110 Nausea: Secondary | ICD-10-CM

## 2020-12-06 DIAGNOSIS — R197 Diarrhea, unspecified: Secondary | ICD-10-CM

## 2020-12-06 MED ORDER — SUPREP BOWEL PREP KIT 17.5-3.13-1.6 GM/177ML PO SOLN
1.0000 | ORAL | 0 refills | Status: DC
Start: 2020-12-06 — End: 2020-12-22

## 2020-12-06 NOTE — Telephone Encounter (Signed)
Spoke to patient's mother this morning regarding rescheduling EGD/Colonoscopy with Dr Meridee Score. Patient is scheduled for 12/22/20. Bowel prep sent to pharmacy. New instructions mailed to patient. He will contact the office with any questions.

## 2020-12-19 ENCOUNTER — Other Ambulatory Visit: Payer: Self-pay | Admitting: Gastroenterology

## 2020-12-22 ENCOUNTER — Other Ambulatory Visit: Payer: Self-pay

## 2020-12-22 ENCOUNTER — Ambulatory Visit (AMBULATORY_SURGERY_CENTER): Payer: Medicare Other | Admitting: Gastroenterology

## 2020-12-22 ENCOUNTER — Encounter: Payer: Self-pay | Admitting: Gastroenterology

## 2020-12-22 VITALS — BP 153/98 | HR 81 | Temp 98.2°F | Resp 20 | Ht 70.0 in | Wt 338.0 lb

## 2020-12-22 DIAGNOSIS — K229 Disease of esophagus, unspecified: Secondary | ICD-10-CM

## 2020-12-22 DIAGNOSIS — K2289 Other specified disease of esophagus: Secondary | ICD-10-CM | POA: Diagnosis not present

## 2020-12-22 DIAGNOSIS — K529 Noninfective gastroenteritis and colitis, unspecified: Secondary | ICD-10-CM

## 2020-12-22 DIAGNOSIS — K219 Gastro-esophageal reflux disease without esophagitis: Secondary | ICD-10-CM | POA: Diagnosis not present

## 2020-12-22 DIAGNOSIS — K641 Second degree hemorrhoids: Secondary | ICD-10-CM | POA: Diagnosis not present

## 2020-12-22 DIAGNOSIS — R1310 Dysphagia, unspecified: Secondary | ICD-10-CM | POA: Diagnosis not present

## 2020-12-22 DIAGNOSIS — K5289 Other specified noninfective gastroenteritis and colitis: Secondary | ICD-10-CM | POA: Diagnosis not present

## 2020-12-22 DIAGNOSIS — R197 Diarrhea, unspecified: Secondary | ICD-10-CM

## 2020-12-22 DIAGNOSIS — R1013 Epigastric pain: Secondary | ICD-10-CM

## 2020-12-22 MED ORDER — SODIUM CHLORIDE 0.9 % IV SOLN: 500.0000 mL | INTRAVENOUS | Status: DC

## 2020-12-22 NOTE — Op Note (Signed)
[facility] Patient Name: Tyler Cunningham Procedure Date: 12/22/2020 1:18 PM MRN: [id number] Endoscopist: Justice Britain , MD Age: 20 Referring MD:  Date of Birth: 10-16-2000 Gender: Male Account #: [id number] Procedure:                Colonoscopy Indications:              Diarrhea Medicines:                Monitored Anesthesia Care Procedure:                Pre-Anesthesia Assessment:                           - Prior to the procedure, a History and Physical                            was performed, and patient medications and                            allergies were reviewed. The patient's tolerance of                            previous anesthesia was also reviewed. The risks                            and benefits of the procedure and the sedation                            options and risks were discussed with the patient.                            All questions were answered, and informed consent                            was obtained. Prior Anticoagulants: The patient has                            taken no previous anticoagulant or antiplatelet                            agents except for NSAID medication. ASA Grade                            Assessment: III - A patient with severe systemic                            disease. After reviewing the risks and benefits,                            the patient was deemed in satisfactory condition to                            undergo the procedure.  After obtaining informed consent, the colonoscope                            was passed under direct vision. Throughout the                            procedure, the patient's blood pressure, pulse, and                            oxygen saturations were monitored continuously. The                            CF [id number] #[id number] was introduced through the anus                            and advanced to the 10 cm into the ileum. The                             colonoscopy was performed without difficulty. The                            patient tolerated the procedure. The quality of the                            bowel preparation was good. The terminal ileum,                            ileocecal valve, appendiceal orifice, and rectum                            were photographed. Scope In: 2:04:11 PM Scope Out: 2:16:29 PM Scope Withdrawal Time: 0 hours 9 minutes 38 seconds  Total Procedure Duration: 0 hours 12 minutes 18 seconds  Findings:                 The digital rectal exam findings include                            hemorrhoids. Pertinent negatives include no                            palpable rectal lesions.                           Patchy inflammation, mild in severity and                            characterized by erosions and granularity was found                            in the terminal ileum. Biopsies were taken with a                            cold forceps for histology to rule out chronic  ileitis.                           Normal mucosa was found in the entire colon.                            Biopsies for histology were taken with a cold                            forceps from the entire colon for evaluation of                            microscopic colitis.                           Non-bleeding non-thrombosed external and internal                            hemorrhoids were found during retroflexion, during                            perianal exam and during digital exam. The                            hemorrhoids were Grade II (internal hemorrhoids                            that prolapse but reduce spontaneously). Complications:            No immediate complications. Estimated Blood Loss:     Estimated blood loss was minimal. Impression:               - Hemorrhoids found on digital rectal exam.                           - Possible Ileitis vs preparation artifact.                             Biopsied.                           - Normal mucosa in the entire examined colon.                            Biopsied.                           - Non-bleeding non-thrombosed external and internal                            hemorrhoids. Recommendation:           - The patient will be observed post-procedure,                            until all discharge criteria are met.                           -  Discharge patient to home.                           - Patient has a contact number available for                            emergencies. The signs and symptoms of potential                            delayed complications were discussed with the                            patient. Return to normal activities tomorrow.                            Written discharge instructions were provided to the                            patient.                           - High fiber diet.                           - Use Benefiber or Metamucil 1-2 times daily to                            bulk stools.                           - Await pathology results.                           - Repeat colonoscopy for surveillance based on                            pathology results.                           - The findings and recommendations were discussed                            with the patient.                           - The findings and recommendations were discussed                            with the patient's family. Justice Britain, MD 12/22/2020 2:27:47 PM

## 2020-12-22 NOTE — Patient Instructions (Signed)
Discharge instructions given. Handouts on a dilatation diet and Hemorrhoids. Resume previous medications. YOU HAD AN ENDOSCOPIC PROCEDURE TODAY AT THE [facility]:   Refer to the procedure report that was given to you for any specific questions about what was found during the examination.  If the procedure report does not answer your questions, please call your gastroenterologist to clarify.  If you requested that your care partner not be given the details of your procedure findings, then the procedure report has been included in a sealed envelope for you to review at your convenience later.  YOU SHOULD EXPECT: Some feelings of bloating in the abdomen. Passage of more gas than usual.  Walking can help get rid of the air that was put into your GI tract during the procedure and reduce the bloating. If you had a lower endoscopy (such as a colonoscopy or flexible sigmoidoscopy) you may notice spotting of blood in your stool or on the toilet paper. If you underwent a bowel prep for your procedure, you may not have a normal bowel movement for a few days.  Please Note:  You might notice some irritation and congestion in your nose or some drainage.  This is from the oxygen used during your procedure.  There is no need for concern and it should clear up in a day or so.  SYMPTOMS TO REPORT IMMEDIATELY:  Following lower endoscopy (colonoscopy or flexible sigmoidoscopy):  Excessive amounts of blood in the stool  Significant tenderness or worsening of abdominal pains  Swelling of the abdomen that is new, acute  Fever of 100F or higher  Following upper endoscopy (EGD)  Vomiting of blood or coffee ground material  New chest pain or pain under the shoulder blades  Painful or persistently difficult swallowing  New shortness of breath  Fever of 100F or higher  Black, tarry-looking stools  For urgent or emergent issues, a gastroenterologist can be reached at any hour by calling [phone]  [phone]. Do not use MyChart messaging for urgent concerns.    DIET:  We do recommend a small meal at first, but then you may proceed to your regular diet.  Drink plenty of fluids but you should avoid alcoholic beverages for 24 hours.  ACTIVITY:  You should plan to take it easy for the rest of today and you should NOT DRIVE or use heavy machinery until tomorrow (because of the sedation medicines used during the test).    FOLLOW UP: Our staff will call the number listed on your records 48-72 hours following your procedure to check on you and address any questions or concerns that you may have regarding the information given to you following your procedure. If we do not reach you, we will leave a message.  We will attempt to reach you two times.  During this call, we will ask if you have developed any symptoms of COVID 19. If you develop any symptoms (ie: fever, flu-like symptoms, shortness of breath, cough etc.) before then, please call [phone].  If you test positive for Covid 19 in the 2 weeks post procedure, please call and report this information to [place].    If any biopsies were taken you will be contacted by phone or by letter within the next 1-3 weeks.  Please call us at [phone] if you have not heard about the biopsies in 3 weeks.    SIGNATURES/CONFIDENTIALITY: You and/or your care partner have signed paperwork which will be entered into your electronic medical record.  These  signatures attest to the fact that that the information above on your After Visit Summary has been reviewed and is understood.  Full responsibility of the confidentiality of this discharge information lies with you and/or your care-partner.

## 2020-12-22 NOTE — Progress Notes (Signed)
Pt's states no medical or surgical changes since previsit or office visit. 

## 2020-12-22 NOTE — Progress Notes (Signed)
pt tolerated well. VSS. awake and to recovery. Report given to RN. Pt spit out bite block, no trauma noted. NT removed with ease, no trauma noted.

## 2020-12-22 NOTE — Op Note (Signed)
[facility] Patient Name: Tyler Cunningham Procedure Date: 12/22/2020 1:19 PM MRN: [id number] Endoscopist: Corliss ParishGabriel Mansouraty , MD Age: 20 Referring MD:  Date of Birth: 06/18/2001 Gender: Male Account #: [id number] Procedure:                Upper GI endoscopy Indications:              Epigastric abdominal pain, Dysphagia, Heartburn,                            Diarrhea, Nausea Medicines:                Monitored Anesthesia Care Procedure:                Pre-Anesthesia Assessment:                           - Prior to the procedure, a History and Physical                            was performed, and patient medications and                            allergies were reviewed. The patient's tolerance of                            previous anesthesia was also reviewed. The risks                            and benefits of the procedure and the sedation                            options and risks were discussed with the patient.                            All questions were answered, and informed consent                            was obtained. Prior Anticoagulants: The patient has                            taken no previous anticoagulant or antiplatelet                            agents. ASA Grade Assessment: III - A patient with                            severe systemic disease. After reviewing the risks                            and benefits, the patient was deemed in                            satisfactory condition to undergo the procedure.  After obtaining informed consent, the endoscope was                            passed under direct vision. Throughout the                            procedure, the patient's blood pressure, pulse, and                            oxygen saturations were monitored continuously. The                            GIF [id number] #[id number] was introduced through the                            mouth, and advanced to the second part  of duodenum.                            The upper GI endoscopy was accomplished without                            difficulty. The patient tolerated the procedure. Scope In: Scope Out: Findings:                 No gross lesions were noted in the entire                            esophagus. Biopsies were taken with a cold forceps                            for histology to rule out EoE/LoE.                           The Z-line was irregular and was found 44 cm from                            the incisors.                           After the rest of the EGD was complete a guidewire                            was placed and the scope was withdrawn. Dilation                            was performed in the esophagus with a Savary                            dilator with no resistance at 18 mm. The dilation                            site was examined following endoscope reinsertion  and showed no change.                           Patchy mildly erythematous mucosa without bleeding                            was found in the entire examined stomach. Biopsies                            were taken with a cold forceps for histology and                            Helicobacter pylori testing.                           No gross lesions were noted in the duodenal bulb,                            in the first portion of the duodenum and in the                            second portion of the duodenum. Biopsies were taken                            with a cold forceps for histology. Complications:            No immediate complications. Estimated Blood Loss:     Estimated blood loss was minimal. Impression:               - No gross lesions in esophagus. Biopsied. Z-line                            irregular, 44 cm from the incisors. Esophageal                            dilation performed.                           - Erythematous mucosa in the stomach. Biopsied.                            - No gross lesions in the duodenal bulb, in the                            first portion of the duodenum and in the second                            portion of the duodenum. Biopsied. Recommendation:           - Proceed to scheduled colonoscopy.                           - Observe patient's clinical course.                           -  Continue present medications.                           - If dysphagia symptoms persist consider Manometry.                           - The findings and recommendations were discussed                            with the patient.                           - The findings and recommendations were discussed                            with the patient's family. Corliss Parish, MD 12/22/2020 2:23:44 PM

## 2020-12-22 NOTE — Progress Notes (Signed)
Called to room to assist during endoscopic procedure.  Patient ID and intended procedure confirmed with present staff. Received instructions for my participation in the procedure from the performing physician.  

## 2020-12-26 ENCOUNTER — Telehealth: Payer: Self-pay

## 2020-12-26 NOTE — Telephone Encounter (Signed)
  Follow up Call-  Call back number 12/22/2020  Post procedure Call Back phone  # Elson Clan (mother) [phone]  Permission to leave phone message Yes  Some recent data might be hidden     Patient questions:  Do you have a fever, pain , or abdominal swelling? No. Pain Score  0 *  Have you tolerated food without any problems? Yes.    Have you been able to return to your normal activities? Yes.    Do you have any questions about your discharge instructions: Diet   No. Medications  No. Follow up visit  No.  Do you have questions or concerns about your Care? No.  Actions: * If pain score is 4 or above: No action needed, pain <4.  Have you developed a fever since your procedure? no  2.   Have you had an respiratory symptoms (SOB or cough) since your procedure? no  3.   Have you tested positive for COVID 19 since your procedure no  4.   Have you had any family members/close contacts diagnosed with the COVID 19 since your procedure?  no   If yes to any of these questions please route to Laverna Peace, RN and Karlton Lemon, RN

## 2020-12-29 ENCOUNTER — Encounter: Payer: Self-pay | Admitting: Gastroenterology

## 2021-01-02 ENCOUNTER — Encounter: Payer: Self-pay | Admitting: Emergency Medicine

## 2021-01-02 ENCOUNTER — Other Ambulatory Visit: Payer: Self-pay

## 2021-01-02 ENCOUNTER — Ambulatory Visit
Admission: EM | Admit: 2021-01-02 | Discharge: 2021-01-02 | Disposition: A | Discharge status: Home / Self Care | Payer: Medicare Other | Attending: Physician Assistant | Admitting: Physician Assistant

## 2021-01-02 DIAGNOSIS — J069 Acute upper respiratory infection, unspecified: Secondary | ICD-10-CM | POA: Diagnosis not present

## 2021-01-02 MED ORDER — ONDANSETRON 4 MG PO TBDP: 4.0000 mg | ORAL_TABLET | Freq: Three times a day (TID) | ORAL | 0 refills | Status: DC | PRN

## 2021-01-02 NOTE — Discharge Instructions (Addendum)
Return if any problems. Covid is pending 

## 2021-01-02 NOTE — ED Triage Notes (Signed)
Pt has multiple flu-like complaints since Friday. Joint pain, chills, dysphasia, sore throat, headache. Hx autism.

## 2021-01-03 LAB — COVID-19, FLU A+B NAA
Influenza A, NAA: NOT DETECTED
Influenza B, NAA: NOT DETECTED
SARS-CoV-2, NAA: NOT DETECTED

## 2021-01-04 NOTE — ED Provider Notes (Signed)
[facility]    CSN: [id number] Arrival date & time: 01/02/21  1600      History   Chief Complaint Chief Complaint  Patient presents with   Cough   Shortness of Breath   Sore Throat   Nausea   Dizziness   Generalized Body Aches    HPI Tyler Cunningham is a 20 y.o. male.   The history is provided by the patient. No language interpreter was used.  Cough Cough characteristics:  Non-productive Sputum characteristics:  Nondescript Severity:  Moderate Onset quality:  Gradual Duration:  3 days Timing:  Constant Progression:  Worsening Chronicity:  New Smoker: no   Relieved by:  Nothing Worsened by:  Nothing Associated symptoms: shortness of breath   Risk factors: no recent infection   Shortness of Breath Associated symptoms: cough   Sore Throat Associated symptoms include shortness of breath.  Dizziness Associated symptoms: shortness of breath    Past Medical History:  Diagnosis Date   Anxiety    Asthma    Autism    Depression     Patient Active Problem List   Diagnosis Date Noted   Abdominal pain, epigastric 09/24/2020   Gastroesophageal reflux disease 09/24/2020   Nausea without vomiting 09/24/2020   Diarrhea 09/24/2020   Anorexia 09/24/2020   Dysphagia 09/24/2020   Odynophagia 09/24/2020   ODD (oppositional defiant disorder) 04/19/2014   Autistic spectrum disorder 08/27/2013    Class: Chronic   PTSD (post-traumatic stress disorder) 08/27/2013    Class: Acute   MDD (major depressive disorder), recurrent episode, moderate (HCC) 08/26/2013    Past Surgical History:  Procedure Laterality Date   PILONIDAL CYST EXCISION N/A 12/29/2019   Procedure: PILONIDAL CYSTECTOMY;  Surgeon: Manus Rudd, MD;  Location: [facility];  Service: General;  Laterality: N/A;   PYLOROPLASTY         Home Medications    Prior to Admission medications   Medication Sig Start Date End Date Taking? Authorizing Provider  ondansetron (ZOFRAN  ODT) 4 MG disintegrating tablet Take 1 tablet (4 mg total) by mouth every 8 (eight) hours as needed for nausea or vomiting. 01/02/21  Yes Elson Areas, PA-C  acetaminophen (TYLENOL) 500 MG tablet Take 1,000 mg by mouth every 8 (eight) hours as needed for moderate pain.    [provider]  albuterol (PROVENTIL HFA;VENTOLIN HFA) 108 (90 BASE) MCG/ACT inhaler Inhale 2 puffs into the lungs every 6 (six) hours as needed. For wheezing.  Patient may resume home supply. 09/01/13   Winson, Louie Bun, NP  buPROPion (WELLBUTRIN XL) 150 MG 24 hr tablet Take 150 mg by mouth every morning. 04/20/20   [provider]  cloNIDine (CATAPRES) 0.1 MG tablet Take 0.1 mg by mouth 2 (two) times daily.    [provider]  famotidine (PEPCID) 20 MG tablet Take 1 tablet (20 mg total) by mouth 2 (two) times daily. 06/24/20   Wieters, Hallie C, PA-C  hyoscyamine (LEVSIN SL) 0.125 MG SL tablet Place 1 tablet (0.125 mg total) under the tongue every 4 (four) hours as needed. 09/21/20   Mansouraty, Netty Starring., MD  ibuprofen (ADVIL) 200 MG tablet Take 600 mg by mouth every 6 (six) hours as needed for moderate pain.    [provider]  omeprazole (PRILOSEC) 40 MG capsule TAKE 1 CAPSULE BY MOUTH TWICE A DAY 12/21/20   Mansouraty, Netty Starring., MD  promethazine (PHENERGAN) 12.5 MG tablet Take 1 tablet (12.5 mg total) by mouth every 8 (eight)  hours as needed for nausea or vomiting. 09/21/20   Mansouraty, Netty Starring., MD  sucralfate (CARAFATE) 1 GM/10ML suspension Take 10 mLs (1 g total) by mouth 4 (four) times daily -  with meals and at bedtime. 06/24/20   Wieters, Hallie C, PA-C  venlafaxine XR (EFFEXOR-XR) 150 MG 24 hr capsule Take 150 mg by mouth daily with breakfast.    [provider]    Family History Family History  Problem Relation Age of Onset   Breast cancer Mother    Lupus Mother    Colon polyps Mother    Heart disease Father    Colon cancer Maternal Grandmother    Heart disease Maternal  Grandmother    Diabetes Maternal Grandfather    Heart disease Maternal Grandfather    Heart disease Paternal Grandmother    Diabetes Paternal Grandfather    Heart disease Paternal Grandfather    Inflammatory bowel disease Neg Hx    Liver disease Neg Hx    Pancreatic cancer Neg Hx    Stomach cancer Neg Hx     Social History Social History   Tobacco Use   Smoking status: Former    Types: Cigarettes   Smokeless tobacco: Never  Vaping Use   Vaping Use: Every day  Substance Use Topics   Alcohol use: No    Comment: Denies; UDS + for alcohol    Drug use: No     Allergies   Other and Pollen extract   Review of Systems Review of Systems  Respiratory:  Positive for cough and shortness of breath.   Neurological:  Positive for dizziness.  All other systems reviewed and are negative.   Physical Exam Triage Vital Signs ED Triage Vitals  Enc Vitals Group     BP 01/02/21 1701 114/67     Pulse Rate 01/02/21 1701 89     Resp 01/02/21 1701 20     Temp 01/02/21 1701 97.8 F (36.6 C)     Temp Source 01/02/21 1701 Oral     SpO2 01/02/21 1701 100 %     Weight --      Height --      Head Circumference --      Peak Flow --      Pain Score 01/02/21 1702 6     Pain Loc --      Pain Edu? --      Excl. in GC? --    No data found.  Updated Vital Signs BP 114/67   Pulse 89   Temp 97.8 F (36.6 C) (Oral)   Resp 20   SpO2 100%   Visual Acuity Right Eye Distance:   Left Eye Distance:   Bilateral Distance:    Right Eye Near:   Left Eye Near:    Bilateral Near:     Physical Exam Vitals and nursing note reviewed.  Constitutional:      Appearance: He is well-developed.  HENT:     Head: Normocephalic and atraumatic.  Eyes:     Conjunctiva/sclera: Conjunctivae normal.  Cardiovascular:     Rate and Rhythm: Normal rate and regular rhythm.     Heart sounds: No murmur heard. Pulmonary:     Effort: Pulmonary effort is normal. No respiratory distress.     Breath sounds:  Normal breath sounds.  Abdominal:     Palpations: Abdomen is soft.     Tenderness: There is no abdominal tenderness.  Musculoskeletal:        General: Normal range of  motion.     Cervical back: Neck supple.  Skin:    General: Skin is warm and dry.  Neurological:     General: No focal deficit present.     Mental Status: He is alert.     UC Treatments / Results  Labs (all labs ordered are listed, but only abnormal results are displayed) Labs Reviewed  COVID-19, FLU A+B NAA   Narrative:    Performed at:  [address] [place], [place]  [id number] Lab Director: Jolene Schimke MD, Phone:  [phone]    EKG   Radiology No results found.  Procedures Procedures (including critical care time)  Medications Ordered in UC Medications - No data to display  Initial Impression / Assessment and Plan / UC Course  I have reviewed the triage vital signs and the nursing notes.  Pertinent labs & imaging results that were available during my care of the patient were reviewed by me and considered in my medical decision making (see chart for details).     MDM:  Illness sound viral,  covid ordered  I will give rx for zofran.  I advised tylenol  Follow up with primary care  Final Clinical Impressions(s) / UC Diagnoses   Final diagnoses:  Viral URI     Discharge Instructions      Return if any problems.  Covid is pending    ED Prescriptions     Medication Sig Dispense Auth. Provider   ondansetron (ZOFRAN ODT) 4 MG disintegrating tablet Take 1 tablet (4 mg total) by mouth every 8 (eight) hours as needed for nausea or vomiting. 20 tablet Elson Areas, [place]      PDMP not reviewed this encounter. An After Visit Summary was printed and given to the patient.    Elson Areas, [place] 01/04/21 2157

## 2021-10-16 IMAGING — US US ABDOMEN COMPLETE
1 series · 14 of 25 positions shown · non-contrast
Comparison: None.

CLINICAL DATA: Epigastric pain with nausea and diarrhea history of
reflux

EXAM:
ABDOMEN ULTRASOUND COMPLETE

[Series 1: us abdomen complete · 14 of 76 slices shown]
[im 1/76]
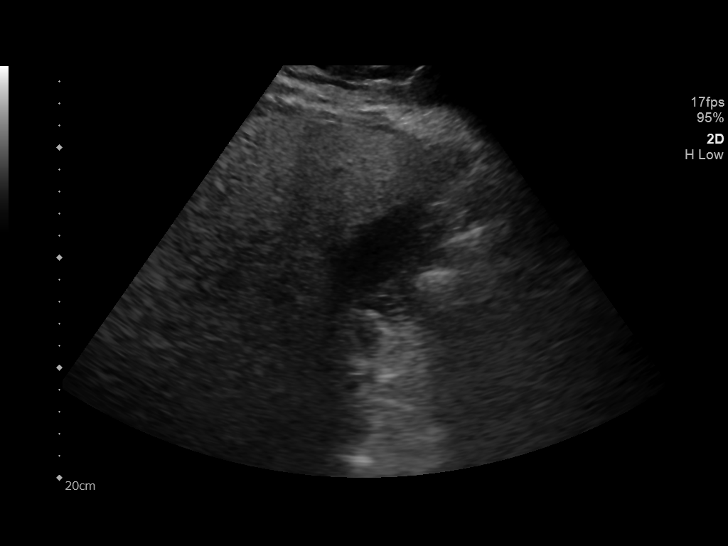
[im 7/76]
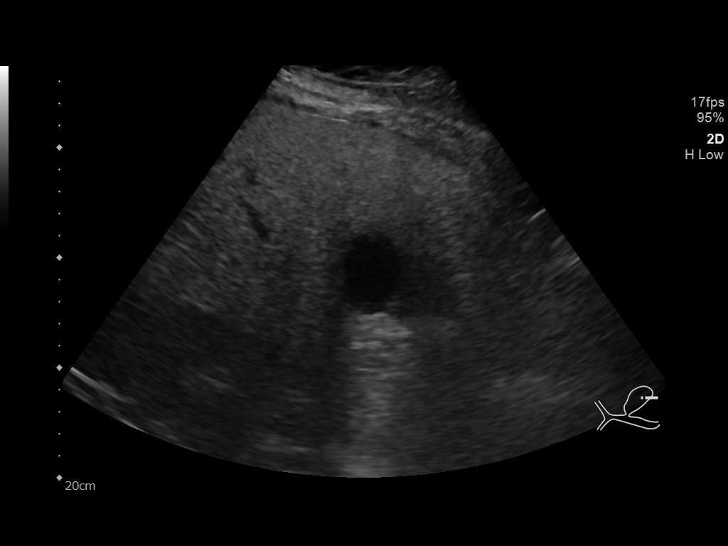
[im 13/76]
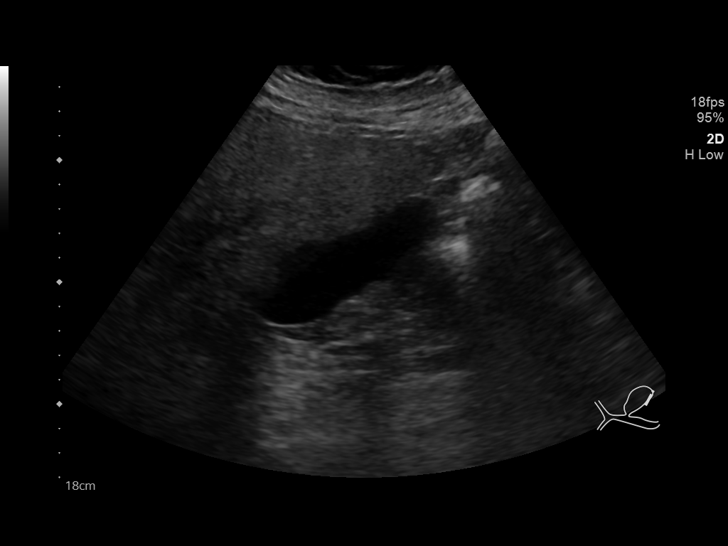
[im 19/76]
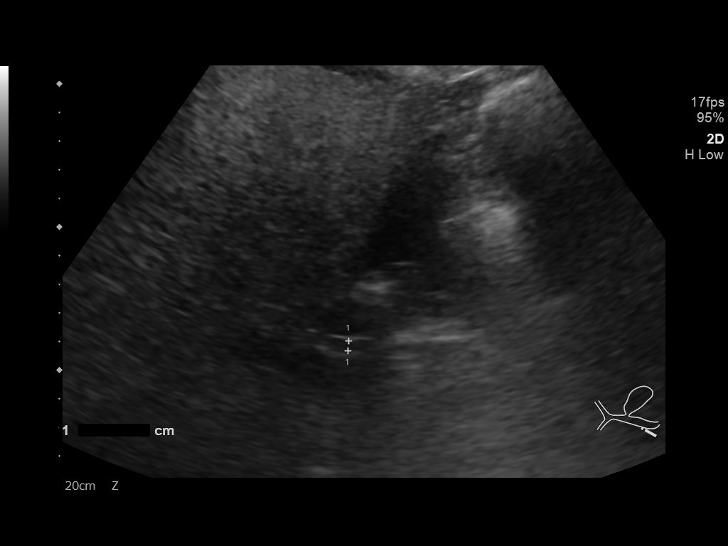
[im 26/76]
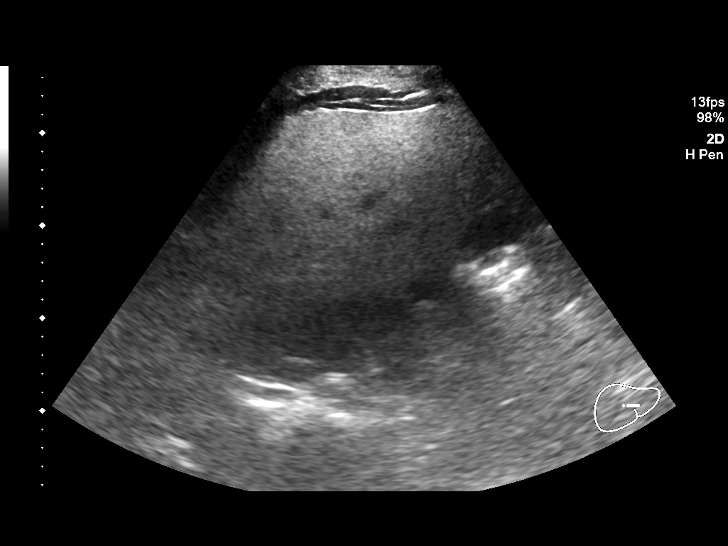
[im 29/76]
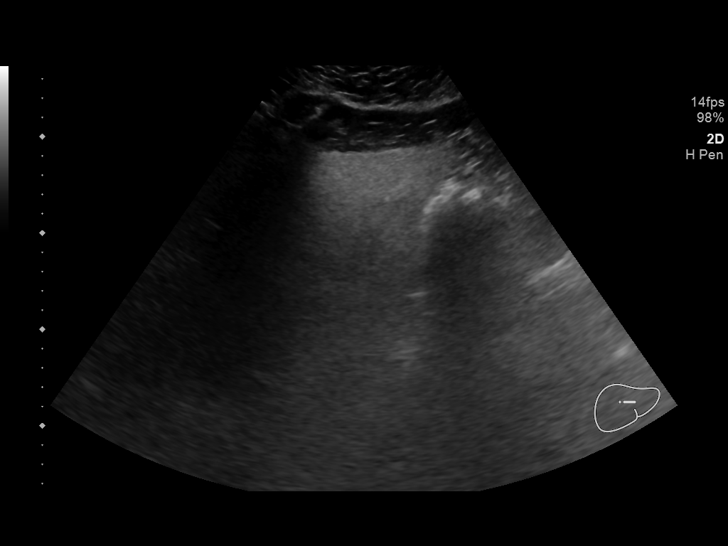
[im 35/76]
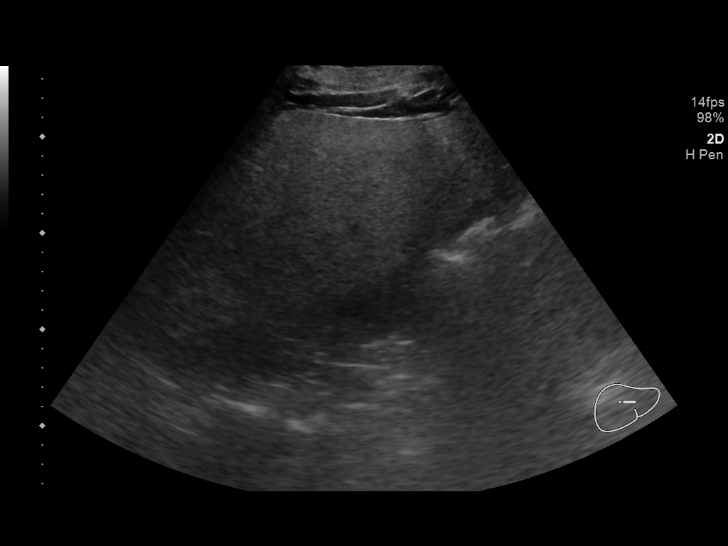
[im 41/76]
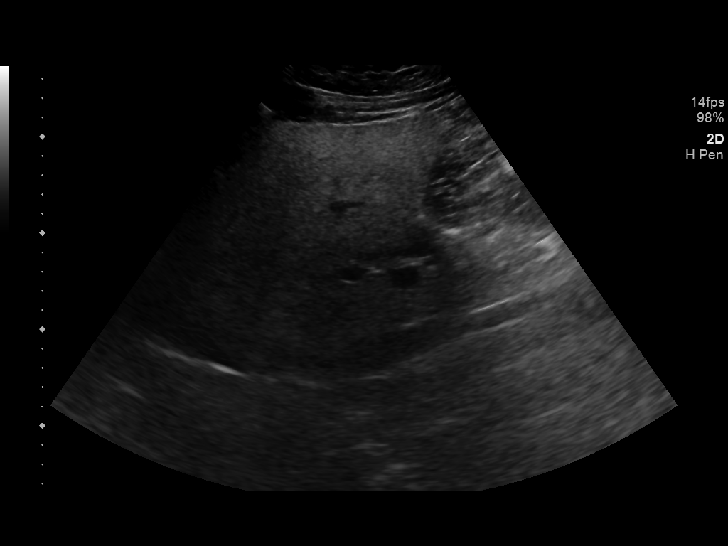
[im 47/76]
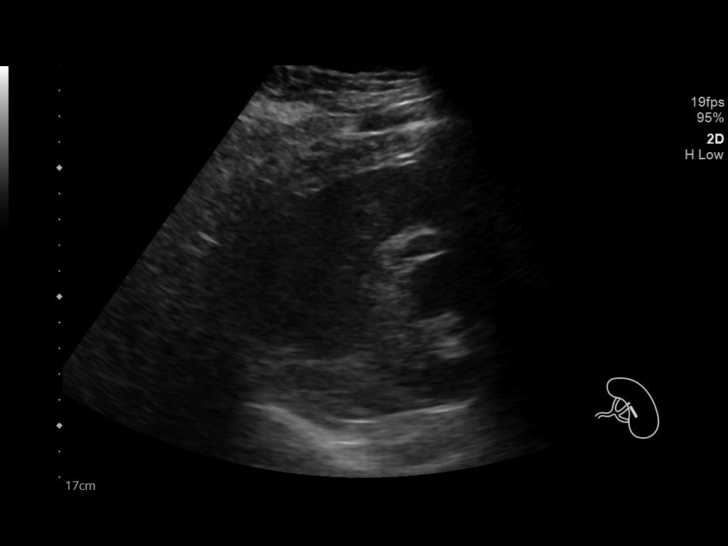
[im 51/76]
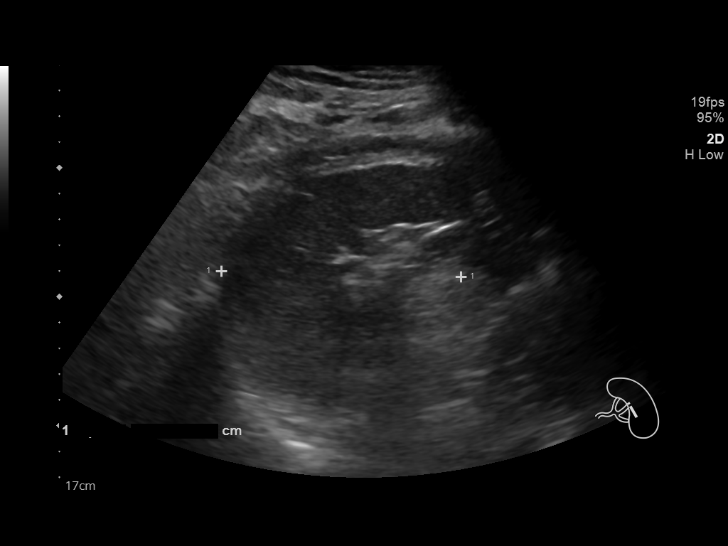
[im 57/76]
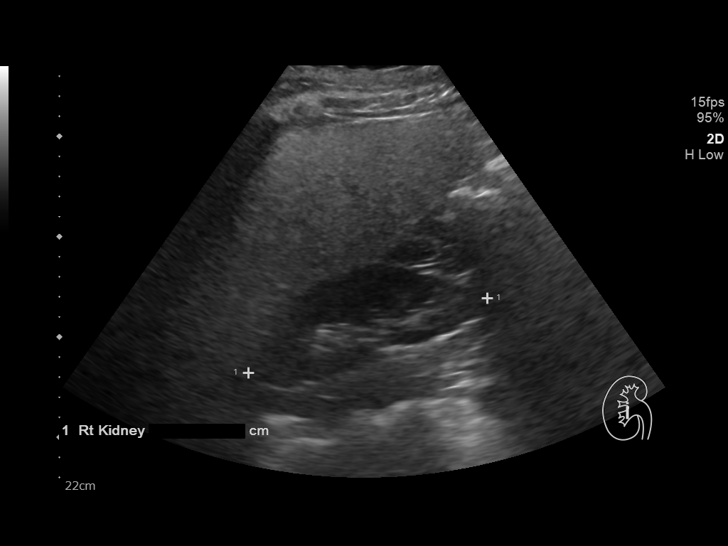
[im 63/76]
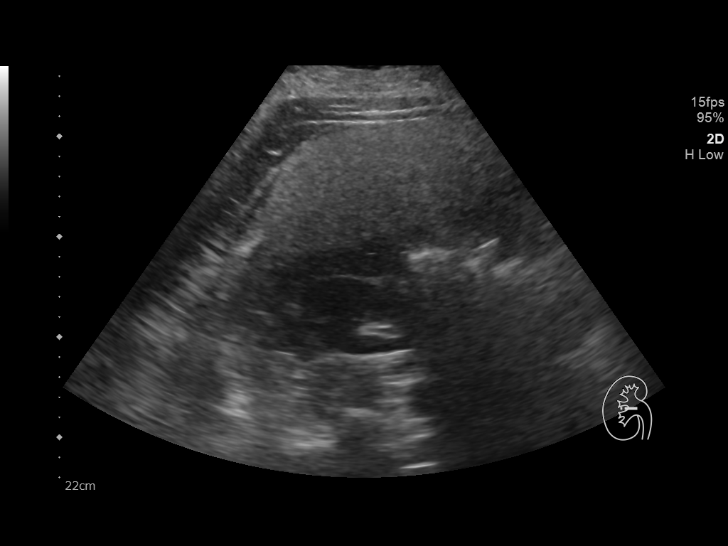
[im 69/76]
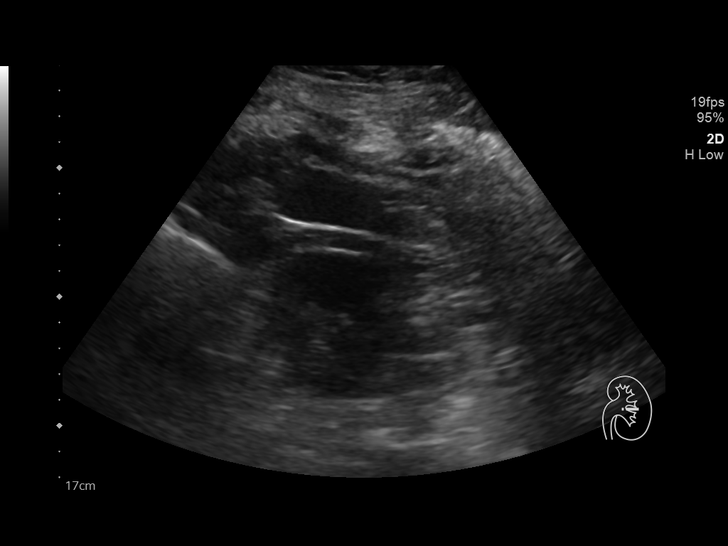
[im 76/76]
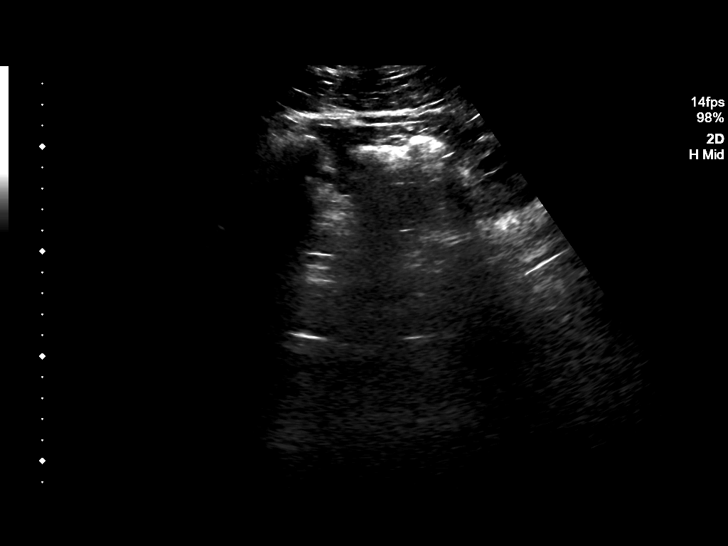

[14 of 25 positions shown; findings below may reference images not displayed]

FINDINGS: Gallbladder: No gallstones or wall thickening visualized. No
sonographic Murphy sign noted by sonographer.

Common bile duct: Diameter: 4 mm

Liver: No focal lesion identified. Diffusely increased parenchymal
echogenicity. Portal vein is patent on color Doppler imaging with
normal direction of blood flow towards the liver.

IVC: No abnormality visualized.

Pancreas: Visualized portion unremarkable.

Spleen: Size and appearance within normal limits.

Right Kidney: Length: 12.5 cm. Echogenicity within normal limits. No
mass or hydronephrosis visualized.

Left Kidney: Length: 11.3 cm. Echogenicity within normal limits. No
mass or hydronephrosis visualized.

Abdominal aorta: No aneurysm visualized.

Other findings: None.
IMPRESSION: 1. The echogenicity of the liver is increased. This is a nonspecific
finding but is most commonly seen with fatty infiltration of the
liver, and in the setting of steatohepatitis can be a cause of
abdominal pain. There are no obvious focal liver lesions.

## 2021-11-05 ENCOUNTER — Other Ambulatory Visit: Payer: Self-pay | Admitting: Gastroenterology

## 2021-11-30 ENCOUNTER — Encounter (HOSPITAL_BASED_OUTPATIENT_CLINIC_OR_DEPARTMENT_OTHER): Payer: Self-pay

## 2021-11-30 ENCOUNTER — Other Ambulatory Visit: Payer: Self-pay

## 2021-11-30 ENCOUNTER — Emergency Department (HOSPITAL_BASED_OUTPATIENT_CLINIC_OR_DEPARTMENT_OTHER)
Admission: EM | Admit: 2021-11-30 | Discharge: 2021-12-01 | Disposition: A | Discharge status: Home / Self Care | Payer: 59 | Attending: Emergency Medicine | Admitting: Emergency Medicine

## 2021-11-30 DIAGNOSIS — R197 Diarrhea, unspecified: Secondary | ICD-10-CM | POA: Diagnosis present

## 2021-11-30 DIAGNOSIS — Z7951 Long term (current) use of inhaled steroids: Secondary | ICD-10-CM | POA: Insufficient documentation

## 2021-11-30 DIAGNOSIS — K529 Noninfective gastroenteritis and colitis, unspecified: Secondary | ICD-10-CM | POA: Insufficient documentation

## 2021-11-30 DIAGNOSIS — J45909 Unspecified asthma, uncomplicated: Secondary | ICD-10-CM | POA: Diagnosis not present

## 2021-11-30 DIAGNOSIS — Z79899 Other long term (current) drug therapy: Secondary | ICD-10-CM | POA: Diagnosis not present

## 2021-11-30 DIAGNOSIS — Z87891 Personal history of nicotine dependence: Secondary | ICD-10-CM | POA: Diagnosis not present

## 2021-11-30 DIAGNOSIS — F840 Autistic disorder: Secondary | ICD-10-CM | POA: Insufficient documentation

## 2021-11-30 DIAGNOSIS — I10 Essential (primary) hypertension: Secondary | ICD-10-CM | POA: Insufficient documentation

## 2021-11-30 HISTORY — DX: Essential (primary) hypertension: I10

## 2021-11-30 MED ORDER — ONDANSETRON HCL 4 MG/2ML IJ SOLN
4.0000 mg | Freq: Once | INTRAMUSCULAR | Status: AC
  Administered 2021-12-01: 4 mg via INTRAVENOUS
  Filled 2021-11-30: qty 2

## 2021-11-30 MED ORDER — SODIUM CHLORIDE 0.9 % IV BOLUS
1000.0000 mL | Freq: Once | INTRAVENOUS | Status: AC
  Administered 2021-12-01: 1000 mL via INTRAVENOUS

## 2021-11-30 MED ORDER — METOCLOPRAMIDE HCL 5 MG/ML IJ SOLN
10.0000 mg | Freq: Once | INTRAMUSCULAR | Status: DC | PRN
  Filled 2021-11-30: qty 2

## 2021-11-30 MED ORDER — FENTANYL CITRATE PF 50 MCG/ML IJ SOSY
100.0000 ug | PREFILLED_SYRINGE | Freq: Once | INTRAMUSCULAR | Status: AC
  Administered 2021-12-01: 100 ug via INTRAVENOUS
  Filled 2021-11-30: qty 2

## 2021-11-30 NOTE — ED Provider Notes (Signed)
[facility] Provider Note: Lowella Dell, MD, FACEP  CSN: [id number] MRN: [id number] ARRIVAL: 11/30/21 at 2311 ROOM: DB012/DB012   CHIEF COMPLAINT  Diarrhea   HISTORY OF PRESENT ILLNESS  11/30/21 11:37 PM Tyler Cunningham is a 21 y.o. male with autism spectrum disorder.  He is here with explosive diarrhea (x5) and 1 episode of vomiting that began about 4 PM.  He is having generalized body pain worse in his abdomen.  The abdominal pain is diffuse and he rates it as a 10 out of 10.  It is worse with palpation or movement.  He describes it as grimacing and nagging.   Past Medical History:  Diagnosis Date   Anxiety    Asthma    Autism    Depression    Hypertension     Past Surgical History:  Procedure Laterality Date   PILONIDAL CYST EXCISION N/A 12/29/2019   Procedure: PILONIDAL CYSTECTOMY;  Surgeon: Manus Rudd, MD;  Location: [facility];  Service: General;  Laterality: N/A;   PYLOROPLASTY      Family History  Problem Relation Age of Onset   Breast cancer Mother    Lupus Mother    Colon polyps Mother    Heart disease Father    Colon cancer Maternal Grandmother    Heart disease Maternal Grandmother    Diabetes Maternal Grandfather    Heart disease Maternal Grandfather    Heart disease Paternal Grandmother    Diabetes Paternal Grandfather    Heart disease Paternal Grandfather    Inflammatory bowel disease Neg Hx    Liver disease Neg Hx    Pancreatic cancer Neg Hx    Stomach cancer Neg Hx     Social History   Tobacco Use   Smoking status: Former    Types: Cigarettes   Smokeless tobacco: Never  Vaping Use   Vaping Use: Every day  Substance Use Topics   Alcohol use: No    Comment: Denies; UDS + for alcohol    Drug use: No    Prior to Admission medications   Medication Sig Start Date End Date Taking? Authorizing Provider  ondansetron (ZOFRAN) 8 MG tablet Take 1 tablet (8 mg total) by mouth every 8 (eight) hours as needed for  nausea. 12/01/21  Yes Leith Szafranski, MD  acetaminophen (TYLENOL) 500 MG tablet Take 1,000 mg by mouth every 8 (eight) hours as needed for moderate pain.    [provider]  albuterol (PROVENTIL HFA;VENTOLIN HFA) 108 (90 BASE) MCG/ACT inhaler Inhale 2 puffs into the lungs every 6 (six) hours as needed. For wheezing.  Patient may resume home supply. 09/01/13   Winson, Louie Bun, NP  buPROPion (WELLBUTRIN XL) 150 MG 24 hr tablet Take 150 mg by mouth every morning. 04/20/20   [provider]  cloNIDine (CATAPRES) 0.1 MG tablet Take 0.1 mg by mouth 2 (two) times daily.    [provider]  famotidine (PEPCID) 20 MG tablet Take 1 tablet (20 mg total) by mouth 2 (two) times daily. 06/24/20   Wieters, Hallie C, PA-C  hyoscyamine (LEVSIN SL) 0.125 MG SL tablet Place 1 tablet (0.125 mg total) under the tongue every 4 (four) hours as needed. 09/21/20   Mansouraty, Netty Starring., MD  ibuprofen (ADVIL) 200 MG tablet Take 600 mg by mouth every 6 (six) hours as needed for moderate pain.    [provider]  omeprazole (PRILOSEC) 40 MG capsule TAKE 1 CAPSULE BY MOUTH TWICE A DAY 11/06/21  Mansouraty, Netty Starring., MD  promethazine (PHENERGAN) 12.5 MG tablet Take 1 tablet (12.5 mg total) by mouth every 8 (eight) hours as needed for nausea or vomiting. 09/21/20   Mansouraty, Netty Starring., MD  sucralfate (CARAFATE) 1 GM/10ML suspension Take 10 mLs (1 g total) by mouth 4 (four) times daily -  with meals and at bedtime. 06/24/20   Wieters, Hallie C, PA-C  venlafaxine XR (EFFEXOR-XR) 150 MG 24 hr capsule Take 150 mg by mouth daily with breakfast.    [provider]    Allergies Other and Pollen extract   REVIEW OF SYSTEMS  Negative except as noted here or in the History of Present Illness.   PHYSICAL EXAMINATION  Initial Vital Signs Blood pressure (!) 148/81, pulse (!) 108, temperature 98.2 F (36.8 C), resp. rate 18, height 5\' 10"  (1.778 m), weight (!) 147.4 kg, SpO2 96  %.  Examination General: Well-developed, high BMI male in no acute distress; appearance consistent with age of record HENT: normocephalic; atraumatic Eyes: Normal appearance Neck: supple Heart: regular rate and rhythm; tachycardia Lungs: clear to auscultation bilaterally Abdomen: soft; obese; diffusely tender; bowel sounds present Extremities: No deformity; full range of motion; pulses normal Neurologic: Awake, alert and oriented; motor function intact in all extremities and symmetric; no facial droop Skin: Warm and dry Psychiatric: Flat affect   RESULTS  Summary of this visit's results, reviewed and interpreted by myself:   EKG Interpretation  Date/Time:    Ventricular Rate:    PR Interval:    QRS Duration:   QT Interval:    QTC Calculation:   R Axis:     Text Interpretation:         Laboratory Studies: Results for orders placed or performed during the hospital encounter of 11/30/21 (from the past 24 hour(s))  CBC with Differential     Status: Abnormal   Collection Time: 12/01/21 12:10 AM  Result Value Ref Range   WBC 11.9 (H) 4.0 - 10.5 K/uL   RBC 4.97 4.22 - 5.81 MIL/uL   Hemoglobin 14.7 13.0 - 17.0 g/dL   HCT 12/03/21 38.7 - 56.4 %   MCV 85.9 80.0 - 100.0 fL   MCH 29.6 26.0 - 34.0 pg   MCHC 34.4 30.0 - 36.0 g/dL   RDW 33.2 95.1 - 88.4 %   Platelets 395 150 - 400 K/uL   nRBC 0.0 0.0 - 0.2 %   Neutrophils Relative % 57 %   Neutro Abs 6.8 1.7 - 7.7 K/uL   Lymphocytes Relative 34 %   Lymphs Abs 4.0 0.7 - 4.0 K/uL   Monocytes Relative 6 %   Monocytes Absolute 0.7 0.1 - 1.0 K/uL   Eosinophils Relative 2 %   Eosinophils Absolute 0.3 0.0 - 0.5 K/uL   Basophils Relative 0 %   Basophils Absolute 0.0 0.0 - 0.1 K/uL   Immature Granulocytes 1 %   Abs Immature Granulocytes 0.09 (H) 0.00 - 0.07 K/uL  Comprehensive metabolic panel     Status: Abnormal   Collection Time: 12/01/21 12:10 AM  Result Value Ref Range   Sodium 137 135 - 145 mmol/L   Potassium 4.0 3.5 - 5.1  mmol/L   Chloride 107 98 - 111 mmol/L   CO2 22 22 - 32 mmol/L   Glucose, Bld 174 (H) 70 - 99 mg/dL   BUN 7 6 - 20 mg/dL   Creatinine, Ser 12/03/21 0.61 - 1.24 mg/dL   Calcium 9.6 8.9 - 0.63 mg/dL   Total Protein 7.3  6.5 - 8.1 g/dL   Albumin 4.1 3.5 - 5.0 g/dL   AST 15 15 - 41 U/L   ALT 53 (H) 0 - 44 U/L   Alkaline Phosphatase 42 38 - 126 U/L   Total Bilirubin 0.3 0.3 - 1.2 mg/dL   GFR, Estimated >02 >72 mL/min   Anion gap 8 5 - 15  Lipase, blood     Status: None   Collection Time: 12/01/21 12:10 AM  Result Value Ref Range   Lipase 26 11 - 51 U/L   Imaging Studies: CT ABDOMEN PELVIS W CONTRAST  Result Date: 12/01/2021 CLINICAL DATA:  Abdominal pain, acute, nonlocalized nausea, diarrhea EXAM: CT ABDOMEN AND PELVIS WITH CONTRAST TECHNIQUE: Multidetector CT imaging of the abdomen and pelvis was performed using the standard protocol following bolus administration of intravenous contrast. RADIATION DOSE REDUCTION: This exam was performed according to the departmental dose-optimization program which includes automated exposure control, adjustment of the mA and/or kV according to patient size and/or use of iterative reconstruction technique. CONTRAST:  OMNIPAQUE IOHEXOL 300 MG/ML  SOLN COMPARISON:  None Available. FINDINGS: Lower chest: No acute abnormality Hepatobiliary: Diffuse low-density throughout the liver compatible with fatty infiltration. No focal abnormality. Gallbladder unremarkable. Pancreas: No focal abnormality or ductal dilatation. Spleen: No focal abnormality.  Normal size. Adrenals/Urinary Tract: No adrenal abnormality. No focal renal abnormality. No stones or hydronephrosis. Urinary bladder is unremarkable. Stomach/Bowel: Normal appendix. Stomach, large and small bowel grossly unremarkable. Vascular/Lymphatic: No evidence of aneurysm or adenopathy. Reproductive: No visible focal abnormality. Other: No free fluid or free air. Musculoskeletal: No acute bony abnormality. IMPRESSION:  Hepatic steatosis. No acute findings. Electronically Signed   By: Charlett Nose M.D.   On: 12/01/2021 02:23    ED COURSE and MDM  Nursing notes, initial and subsequent vitals signs, including pulse oximetry, reviewed and interpreted by myself.  Vitals:   11/30/21 2321 11/30/21 2322 12/01/21 0000 12/01/21 0100  BP:  (!) 148/81 (!) 141/81 (!) 138/96  Pulse:  (!) 108 98 97  Resp:  18 16 16   Temp:  98.2 F (36.8 C)    TempSrc:      SpO2:  96% 97% 96%  Weight: (!) 147.4 kg     Height: 5\' 10"  (1.778 m)      Medications  metoCLOPramide (REGLAN) injection 10 mg (has no administration in time range)  sodium chloride 0.9 % bolus 1,000 mL (1,000 mLs Intravenous New Bag/Given 12/01/21 0046)  ondansetron (ZOFRAN) injection 4 mg (4 mg Intravenous Given 12/01/21 0044)  fentaNYL (SUBLIMAZE) injection 100 mcg (100 mcg Intravenous Given 12/01/21 0047)  iohexol (OMNIPAQUE) 300 MG/ML solution 100 mL (100 mLs Intravenous Contrast Given 12/01/21 0212)   2:49 AM Patient feeling much better after IV fluids and medications.  He was advised of reassuring CT scan and laboratory studies.  I suspect this is due to a GI virus.   PROCEDURES  Procedures   ED DIAGNOSES     ICD-10-CM   1. Gastroenteritis  K52.9          [place], MD 12/01/21 [phone]

## 2021-11-30 NOTE — ED Triage Notes (Signed)
Pt reports explosive diarrhea and nausea since 4pm

## 2021-12-01 ENCOUNTER — Emergency Department (HOSPITAL_BASED_OUTPATIENT_CLINIC_OR_DEPARTMENT_OTHER): Payer: 59

## 2021-12-01 DIAGNOSIS — K529 Noninfective gastroenteritis and colitis, unspecified: Secondary | ICD-10-CM | POA: Diagnosis not present

## 2021-12-01 LAB — CBC WITH DIFFERENTIAL/PLATELET
Abs Immature Granulocytes: 0.09 10*3/uL — ABNORMAL HIGH (ref 0.00–0.07)
Basophils Absolute: 0 10*3/uL (ref 0.0–0.1)
Basophils Relative: 0 %
Eosinophils Absolute: 0.3 10*3/uL (ref 0.0–0.5)
Eosinophils Relative: 2 %
HCT: 42.7 % (ref 39.0–52.0)
Hemoglobin: 14.7 g/dL (ref 13.0–17.0)
Immature Granulocytes: 1 %
Lymphocytes Relative: 34 %
Lymphs Abs: 4 10*3/uL (ref 0.7–4.0)
MCH: 29.6 pg (ref 26.0–34.0)
MCHC: 34.4 g/dL (ref 30.0–36.0)
MCV: 85.9 fL (ref 80.0–100.0)
Monocytes Absolute: 0.7 10*3/uL (ref 0.1–1.0)
Monocytes Relative: 6 %
Neutro Abs: 6.8 10*3/uL (ref 1.7–7.7)
Neutrophils Relative %: 57 %
Platelets: 395 10*3/uL (ref 150–400)
RBC: 4.97 MIL/uL (ref 4.22–5.81)
RDW: 12.7 % (ref 11.5–15.5)
WBC: 11.9 10*3/uL — ABNORMAL HIGH (ref 4.0–10.5)
nRBC: 0 % (ref 0.0–0.2)

## 2021-12-01 LAB — COMPREHENSIVE METABOLIC PANEL
ALT: 53 U/L — ABNORMAL HIGH (ref 0–44)
AST: 15 U/L (ref 15–41)
Albumin: 4.1 g/dL (ref 3.5–5.0)
Alkaline Phosphatase: 42 U/L (ref 38–126)
Anion gap: 8 (ref 5–15)
BUN: 7 mg/dL (ref 6–20)
CO2: 22 mmol/L (ref 22–32)
Calcium: 9.6 mg/dL (ref 8.9–10.3)
Chloride: 107 mmol/L (ref 98–111)
Creatinine, Ser: 0.75 mg/dL (ref 0.61–1.24)
GFR, Estimated: >60 mL/min (ref >60)
Glucose, Bld: 174 mg/dL — ABNORMAL HIGH (ref 70–99)
Potassium: 4 mmol/L (ref 3.5–5.1)
Sodium: 137 mmol/L (ref 135–145)
Total Bilirubin: 0.3 mg/dL (ref 0.3–1.2)
Total Protein: 7.3 g/dL (ref 6.5–8.1)

## 2021-12-01 LAB — LIPASE, BLOOD: Lipase: 26 U/L (ref 11–51)

## 2021-12-01 MED ORDER — IOHEXOL 300 MG/ML  SOLN
100.0000 mL | Freq: Once | INTRAMUSCULAR | Status: AC | PRN
  Administered 2021-12-01: 100 mL via INTRAVENOUS

## 2021-12-01 MED ORDER — ONDANSETRON HCL 8 MG PO TABS: 8.0000 mg | ORAL_TABLET | Freq: Three times a day (TID) | ORAL | 0 refills | Status: AC | PRN

## 2021-12-01 NOTE — ED Notes (Signed)
Patient ambulatory to CT dept escorted by mother and CT tech

## 2021-12-01 NOTE — ED Notes (Signed)
Pt and mother/legal guardian agreeable with d/c plan as discussed by provider- this nurse has verbally reinforced d/c instructions and provided pt/mother provided written copy - no additional questions, concerns, needs verbalized- pt ambulatory at discharge independently with steady gait escorted by mother - vitals stable; no distress.

## 2022-08-17 ENCOUNTER — Other Ambulatory Visit: Payer: Self-pay | Admitting: Gastroenterology

## 2022-12-12 IMAGING — CT CT ABD-PELV W/ CM
2 of 4 series · 17 of 46 positions shown, 19 images · IV contrast (agent unspecified)
Comparison: None Available.

CLINICAL DATA: Abdominal pain, acute, nonlocalized nausea, diarrhea

EXAM:
CT ABDOMEN AND PELVIS WITH CONTRAST
TECHNIQUE: Multidetector CT imaging of the abdomen and pelvis was performed
using the standard protocol following bolus administration of
intravenous contrast.

[Series 2: abd pel w · axial · 0.98mm/px · z∈[+753,+1228]mm · 14 of 105 slices shown, 16 images]
[im 5/105  soft-tissue]
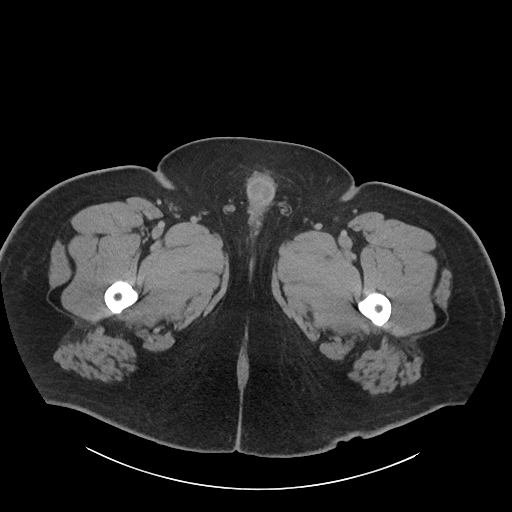
[im 5/105  bone]
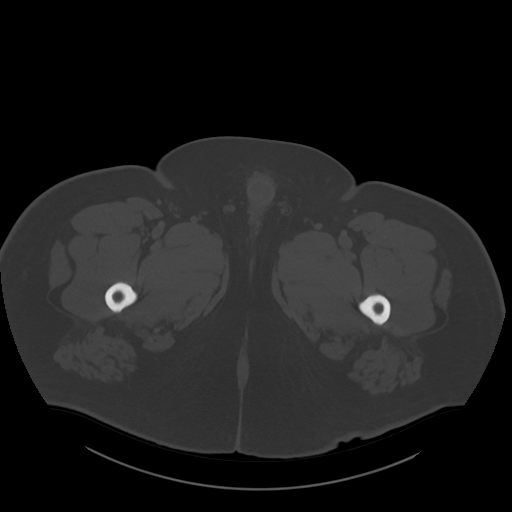
[im 14/105  soft-tissue]
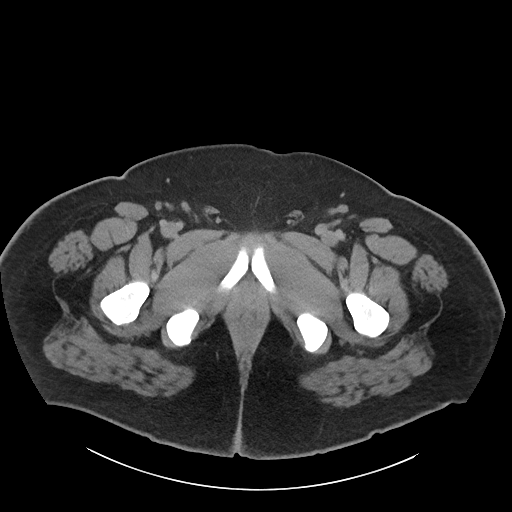
[im 19/105  soft-tissue]
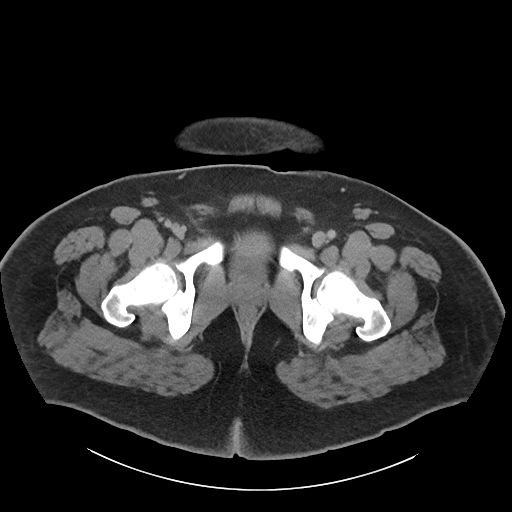
[im 28/105  soft-tissue]
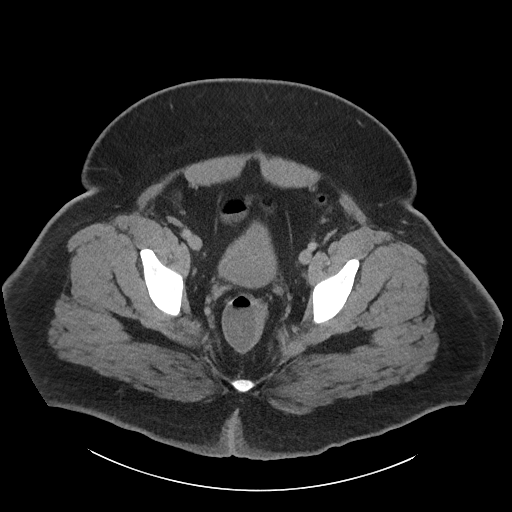
[im 37/105  soft-tissue]
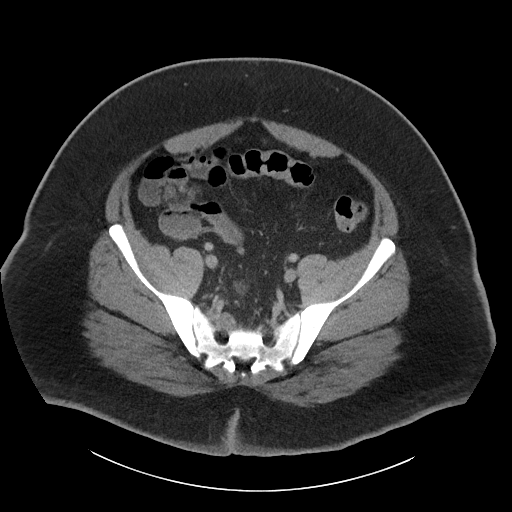
[im 41/105  soft-tissue]
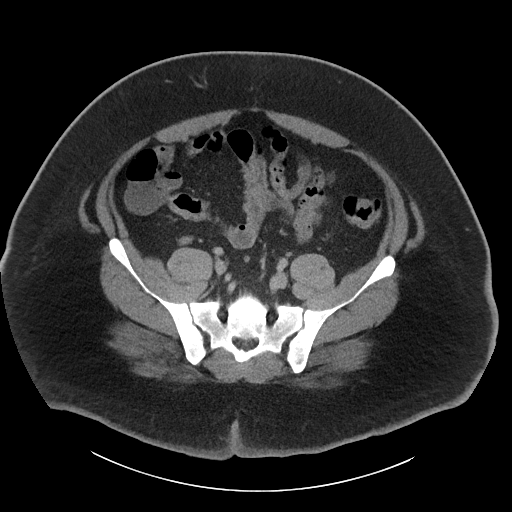
[im 50/105  soft-tissue]
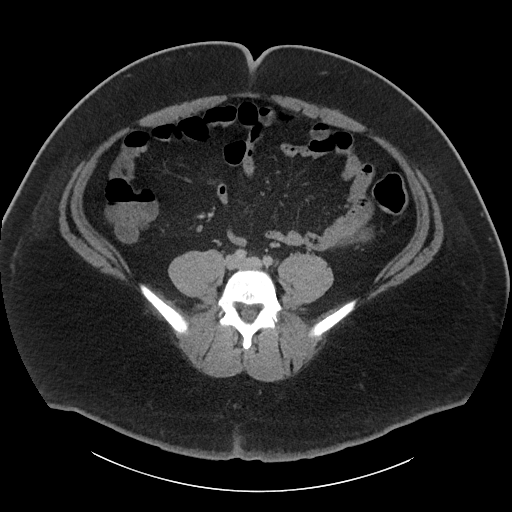
[im 55/105  soft-tissue]
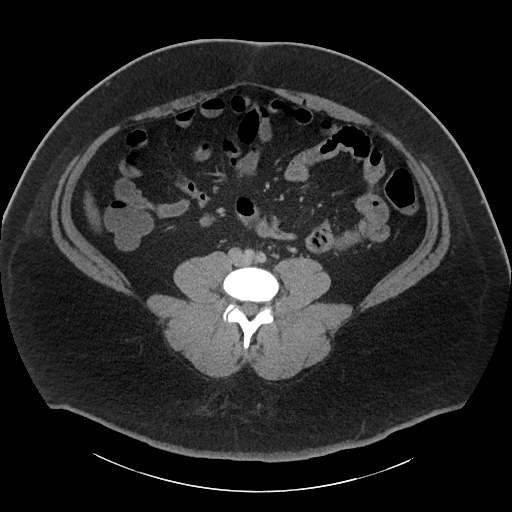
[im 64/105  soft-tissue]
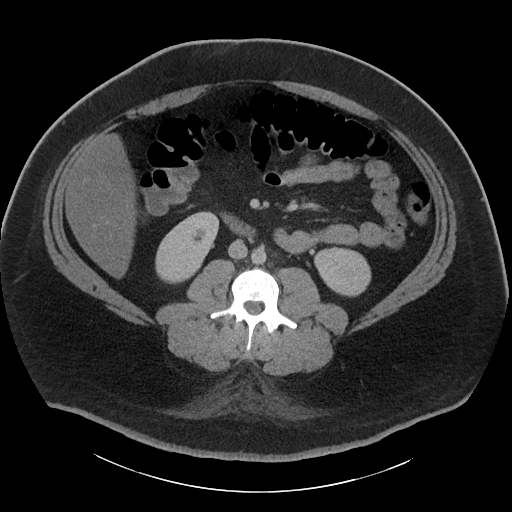
[im 64/105  bone]
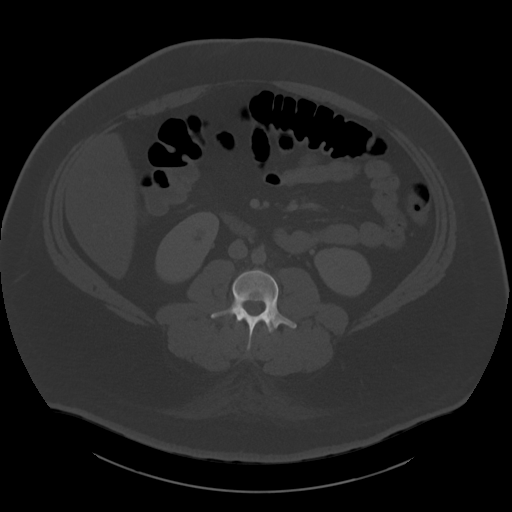
[im 68/105  soft-tissue]
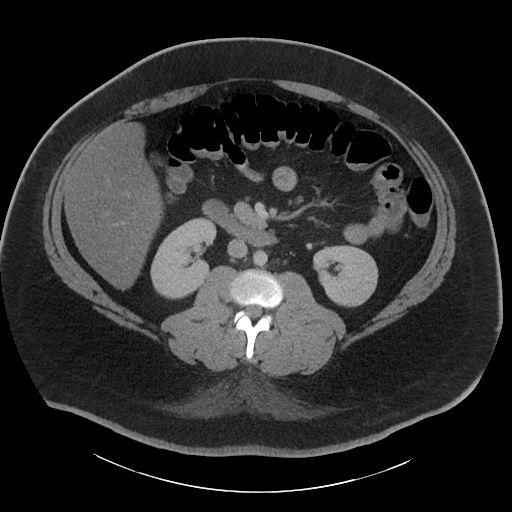
[im 77/105  soft-tissue]
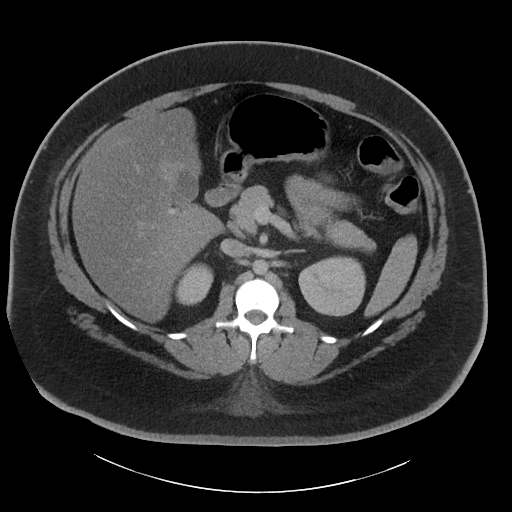
[im 86/105  soft-tissue]
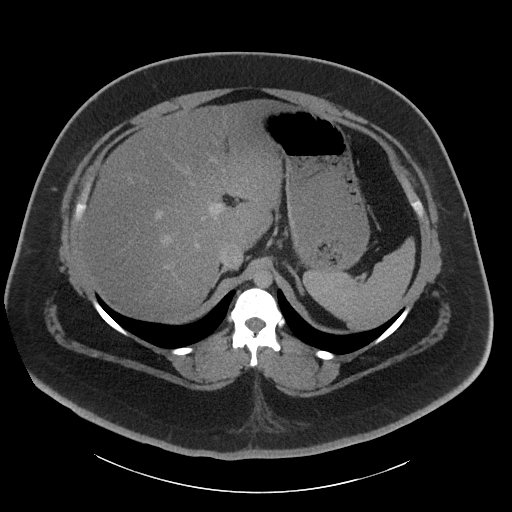
[im 91/105  soft-tissue]
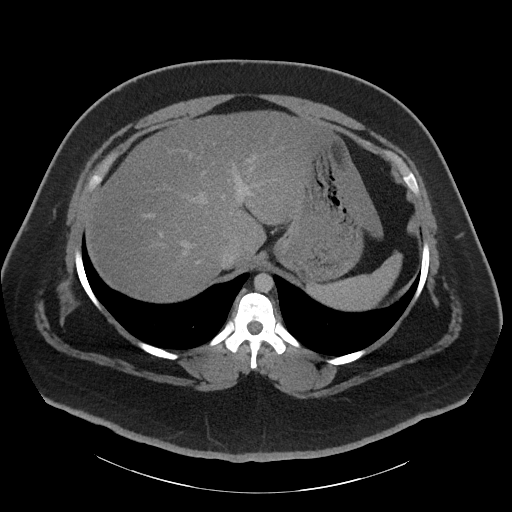
[im 100/105  soft-tissue]
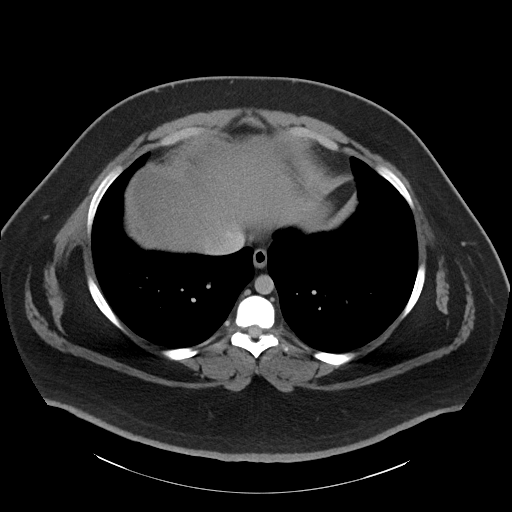

[Series 5: coronal · coronal · 0.99mm/px · 3 of 140 slices shown]
[im 47/140  soft-tissue]
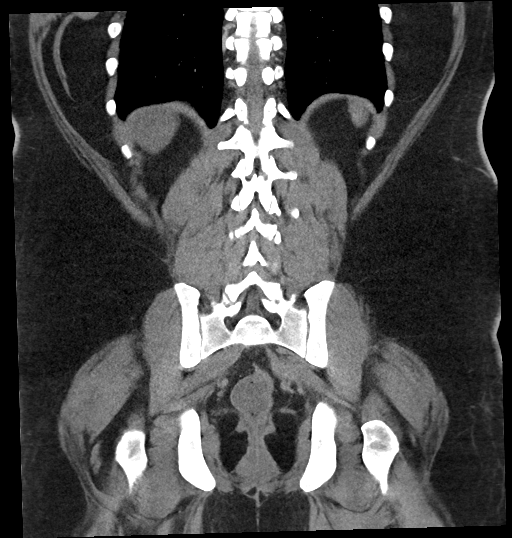
[im 62/140  soft-tissue]
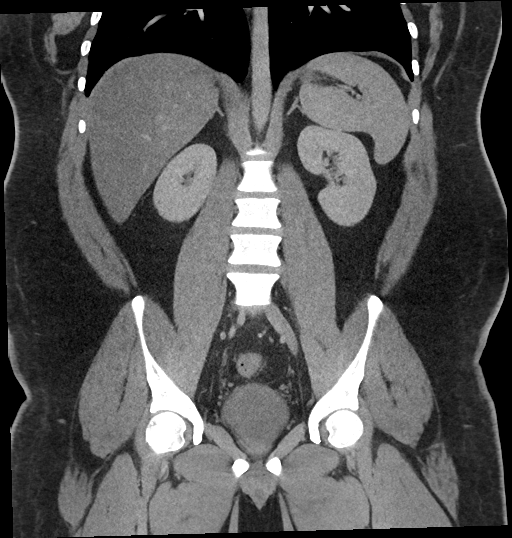
[im 78/140  soft-tissue]
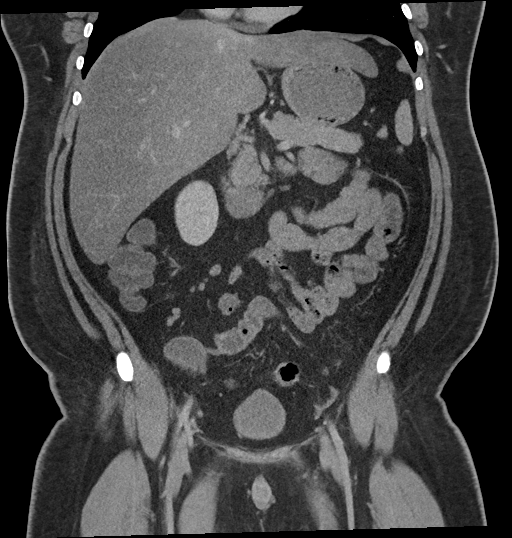

[17 of 46 positions shown; findings below may reference images not displayed]

RADIATION DOSE REDUCTION: This exam was performed according to the
departmental dose-optimization program which includes automated
exposure control, adjustment of the mA and/or kV according to
patient size and/or use of iterative reconstruction technique.

CONTRAST:  100mL OMNIPAQUE IOHEXOL 300 MG/ML  SOLN
FINDINGS: Lower chest: No acute abnormality

Hepatobiliary: Diffuse low-density throughout the liver compatible
with fatty infiltration. No focal abnormality. Gallbladder
unremarkable.

Pancreas: No focal abnormality or ductal dilatation.

Spleen: No focal abnormality.  Normal size.

Adrenals/Urinary Tract: No adrenal abnormality. No focal renal
abnormality. No stones or hydronephrosis. Urinary bladder is
unremarkable.

Stomach/Bowel: Normal appendix. Stomach, large and small bowel
grossly unremarkable.

Vascular/Lymphatic: No evidence of aneurysm or adenopathy.

Reproductive: No visible focal abnormality.

Other: No free fluid or free air.

Musculoskeletal: No acute bony abnormality.
IMPRESSION: Hepatic steatosis.

No acute findings.
